# Patient Record
Sex: Female | Born: 1990 | Race: Black or African American | Hispanic: No | Marital: Married | Smoking: Never smoker
Health system: Southern US, Community
[De-identification: ages and names within clinical notes are randomized; demographics above are authoritative.]

## PROBLEM LIST (undated history)

## (undated) DIAGNOSIS — F32A Depression, unspecified: Secondary | ICD-10-CM

## (undated) DIAGNOSIS — E282 Polycystic ovarian syndrome: Secondary | ICD-10-CM

## (undated) DIAGNOSIS — J302 Other seasonal allergic rhinitis: Secondary | ICD-10-CM

## (undated) DIAGNOSIS — K219 Gastro-esophageal reflux disease without esophagitis: Secondary | ICD-10-CM

## (undated) DIAGNOSIS — F329 Major depressive disorder, single episode, unspecified: Secondary | ICD-10-CM

## (undated) DIAGNOSIS — B009 Herpesviral infection, unspecified: Secondary | ICD-10-CM

## (undated) DIAGNOSIS — F419 Anxiety disorder, unspecified: Secondary | ICD-10-CM

## (undated) DIAGNOSIS — R011 Cardiac murmur, unspecified: Secondary | ICD-10-CM

## (undated) DIAGNOSIS — L509 Urticaria, unspecified: Secondary | ICD-10-CM

## (undated) DIAGNOSIS — H6690 Otitis media, unspecified, unspecified ear: Secondary | ICD-10-CM

## (undated) HISTORY — DX: Polycystic ovarian syndrome: E28.2

## (undated) HISTORY — PX: ABDOMINAL HYSTERECTOMY: SHX81

## (undated) HISTORY — DX: Major depressive disorder, single episode, unspecified: F32.9

## (undated) HISTORY — DX: Gastro-esophageal reflux disease without esophagitis: K21.9

## (undated) HISTORY — PX: TOOTH EXTRACTION: SUR596

## (undated) HISTORY — PX: ESSURE TUBAL LIGATION: SUR464

## (undated) HISTORY — DX: Anxiety disorder, unspecified: F41.9

## (undated) HISTORY — DX: Cardiac murmur, unspecified: R01.1

## (undated) HISTORY — DX: Urticaria, unspecified: L50.9

## (undated) HISTORY — DX: Depression, unspecified: F32.A

## (undated) HISTORY — DX: Otitis media, unspecified, unspecified ear: H66.90

---

## 2010-10-05 ENCOUNTER — Other Ambulatory Visit: Payer: Self-pay | Admitting: Nurse Practitioner

## 2010-10-05 ENCOUNTER — Other Ambulatory Visit (HOSPITAL_COMMUNITY)
Admission: RE | Admit: 2010-10-05 | Discharge: 2010-10-05 | Disposition: A | Discharge status: Home / Self Care | Payer: Medicaid Other | Source: Ambulatory Visit | Attending: Nurse Practitioner | Admitting: Nurse Practitioner

## 2010-10-05 ENCOUNTER — Other Ambulatory Visit (HOSPITAL_COMMUNITY)
Admission: RE | Admit: 2010-10-05 | Discharge: 2010-10-05 | Disposition: A | Discharge status: Home / Self Care | Payer: Medicaid Other | Source: Ambulatory Visit | Attending: Family Medicine | Admitting: Family Medicine

## 2010-10-05 DIAGNOSIS — N870 Mild cervical dysplasia: Secondary | ICD-10-CM | POA: Insufficient documentation

## 2010-10-05 DIAGNOSIS — R87610 Atypical squamous cells of undetermined significance on cytologic smear of cervix (ASC-US): Secondary | ICD-10-CM | POA: Insufficient documentation

## 2010-12-22 ENCOUNTER — Emergency Department (HOSPITAL_COMMUNITY): Payer: Medicaid Other

## 2010-12-22 ENCOUNTER — Emergency Department (HOSPITAL_COMMUNITY)
Admission: EM | Admit: 2010-12-22 | Discharge: 2010-12-22 | Disposition: A | Discharge status: Home / Self Care | Payer: Medicaid Other | Attending: Emergency Medicine | Admitting: Emergency Medicine

## 2010-12-22 ENCOUNTER — Encounter (HOSPITAL_COMMUNITY): Payer: Self-pay | Admitting: Radiology

## 2010-12-22 DIAGNOSIS — R6889 Other general symptoms and signs: Secondary | ICD-10-CM | POA: Insufficient documentation

## 2010-12-22 LAB — POCT PREGNANCY, URINE: Preg Test, Ur: NEGATIVE

## 2010-12-22 MED ORDER — IOHEXOL 300 MG/ML  SOLN
75.0000 mL | Freq: Once | INTRAMUSCULAR | Status: AC | PRN
  Administered 2010-12-22: 75 mL via INTRAVENOUS

## 2011-03-29 ENCOUNTER — Encounter: Payer: Self-pay | Admitting: Cardiology

## 2011-03-29 ENCOUNTER — Ambulatory Visit (INDEPENDENT_AMBULATORY_CARE_PROVIDER_SITE_OTHER): Payer: Medicaid Other | Admitting: Cardiology

## 2011-03-29 VITALS — BP 129/78 | HR 93 | Ht 63.0 in | Wt 176.2 lb

## 2011-03-29 DIAGNOSIS — K219 Gastro-esophageal reflux disease without esophagitis: Secondary | ICD-10-CM

## 2011-03-29 DIAGNOSIS — R079 Chest pain, unspecified: Secondary | ICD-10-CM

## 2011-03-29 DIAGNOSIS — R011 Cardiac murmur, unspecified: Secondary | ICD-10-CM

## 2011-03-29 NOTE — Progress Notes (Signed)
Clinical Summary Ms. Christina Mcdaniel is a 20 y.o.female referred for cardiology consultation by Dr. Dimas Aguas, through the [facility] Department. Very limited records are available. 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 Patient states she moved to [place] last July, has not had cardiology followup since then. Details of her diagnosis are not available as yet. She does report having an echocardiogram done in September of 2010. She thinks that she was told that she had a "hole in a valve." She has not been on any specific antibiotic prophylaxis. She was told that she would likely only need clinical followup on an annual basis unless any symptoms developed.  She reports no functional limitation, exercises 3 or 4 days a week doing aerobic activity for 45 minutes. She does report a lifelong history of intermittent chest pain when she "gets upset." This has not changed in frequency or intensity. She has also had some reflux symptoms which are now improved on proton pump inhibitor.   No Known Allergies  Medication list reviewed.  Past Medical History  Diagnosis Date  . Cardiac murmur   . GERD (gastroesophageal reflux disease)   . Otitis     History reviewed. No pertinent past surgical history.  Family History  Problem Relation Age of Onset  . Heart murmur Father   . Heart murmur Paternal Grandmother   . Heart failure Paternal Aunt     Social History Ms. Christina Mcdaniel reports that she has never smoked. She has never used smokeless tobacco. Ms. Christina Mcdaniel reports that she drinks alcohol.  Review of Systems No reported palpitations, dizziness, or syncope. No orthopnea or PND. No lower extremity edema. Otherwise reviewed and negative.  Physical Examination Filed Vitals:   03/29/11 1009  BP: 129/78  Pulse: 93  Normally nourished appearing young woman in no acute distress. HEENT:  Conjunctiva and lids normal, oropharynx clear. Neck: Supple, no elevated JVD, no carotid bruits. Lungs: Clear to auscultation, nonlabored. Cardiac: Regular rate and rhythm, 2-3/6 systolic murmur heard best at the left upper sternal border toward apex. No change from sitting to standing. No diastolic murmur. No obvious click. No S3. Abdomen: Soft, nontender, bowel sounds present. Skin: Warm and dry. Musculoskeletal: No gross deformities. Neuropsychiatric: Alert and oriented x3, affect appropriate.   ECG Possible ectopic atrial rhythm at 80 beats per minute, RSR prime in lead V1, early repolarization. No old tracing for comparison.    Problem List and Plan

## 2011-03-29 NOTE — Assessment & Plan Note (Signed)
Symptoms better with proton pump inhibitor.

## 2011-03-29 NOTE — Patient Instructions (Addendum)
Your physician you to follow up in 1 year. You will receive a reminder letter in the mail one-two months in advance. If you don't receive a letter, please call our office to schedule the follow-up appointment. Your physician recommends that you continue on your current medications as directed. Please refer to the Current Medication list given to you today. Your physician has requested that you have an echocardiogram. Echocardiography is a painless test that uses sound waves to create images of your heart. It provides your doctor with information about the size and shape of your heart and how well your heart's chambers and valves are working. This procedure takes approximately one hour. There are no restrictions for this procedure.

## 2011-03-29 NOTE — Assessment & Plan Note (Signed)
Atypical in description, no changes in frequency or intensity. Some of her symptoms seemed more consistent with reflux.

## 2011-03-29 NOTE — Assessment & Plan Note (Signed)
Described above. Patient reports no functional limitation. Plan is to request records from [place] including previous echocardiogram. We will also obtain a baseline echocardiogram here for comparison. It may be that she has a VSD or ASD, however details are not certain. Followup can be planned depending on records and echocardiogram.

## 2011-04-14 ENCOUNTER — Other Ambulatory Visit: Payer: Medicaid Other | Admitting: *Deleted

## 2011-04-26 ENCOUNTER — Encounter (HOSPITAL_COMMUNITY): Payer: Self-pay | Admitting: *Deleted

## 2011-04-26 ENCOUNTER — Emergency Department (HOSPITAL_COMMUNITY)
Admission: EM | Admit: 2011-04-26 | Discharge: 2011-04-26 | Disposition: A | Discharge status: Home / Self Care | Payer: Medicaid Other | Attending: Emergency Medicine | Admitting: Emergency Medicine

## 2011-04-26 DIAGNOSIS — N390 Urinary tract infection, site not specified: Secondary | ICD-10-CM

## 2011-04-26 DIAGNOSIS — J029 Acute pharyngitis, unspecified: Secondary | ICD-10-CM

## 2011-04-26 DIAGNOSIS — K219 Gastro-esophageal reflux disease without esophagitis: Secondary | ICD-10-CM | POA: Insufficient documentation

## 2011-04-26 LAB — URINE MICROSCOPIC-ADD ON

## 2011-04-26 LAB — URINALYSIS, ROUTINE W REFLEX MICROSCOPIC
Glucose, UA: NEGATIVE mg/dL
Specific Gravity, Urine: >1.03 — ABNORMAL HIGH (ref 1.005–1.030)
Urobilinogen, UA: 0.2 mg/dL (ref 0.0–1.0)

## 2011-04-26 MED ORDER — ONDANSETRON HCL 4 MG PO TABS
4.0000 mg | ORAL_TABLET | Freq: Once | ORAL | Status: AC
  Administered 2011-04-26: 4 mg via ORAL
  Filled 2011-04-26: qty 1

## 2011-04-26 MED ORDER — PHENAZOPYRIDINE HCL 95 MG PO TABS: 95.0000 mg | ORAL_TABLET | Freq: Three times a day (TID) | ORAL | Status: AC | PRN

## 2011-04-26 MED ORDER — CEPHALEXIN 500 MG PO CAPS
500.0000 mg | ORAL_CAPSULE | Freq: Once | ORAL | Status: AC
  Administered 2011-04-26: 500 mg via ORAL
  Filled 2011-04-26: qty 1

## 2011-04-26 MED ORDER — PHENAZOPYRIDINE HCL 100 MG PO TABS
100.0000 mg | ORAL_TABLET | Freq: Once | ORAL | Status: AC
  Administered 2011-04-26: 100 mg via ORAL
  Filled 2011-04-26: qty 1

## 2011-04-26 MED ORDER — CEPHALEXIN 500 MG PO CAPS: 500.0000 mg | ORAL_CAPSULE | Freq: Four times a day (QID) | ORAL | Status: AC

## 2011-04-26 NOTE — ED Provider Notes (Signed)
History     CSN: [id number] Arrival date & time: 04/26/2011  8:23 PM   First MD Initiated Contact with Patient 04/26/11 2113      Chief Complaint  Patient presents with  . Otalgia  . Dysuria    (Consider location/radiation/quality/duration/timing/severity/associated sxs/prior treatment) Patient is a 20 y.o. female presenting with ear pain and dysuria. The history is provided by the patient.  Otalgia This is a new problem. The current episode started more than 2 days ago. There is pain in both ears. The problem occurs daily. The problem has been gradually worsening. There has been no fever. The pain is moderate. Associated symptoms include ear discharge. Pertinent negatives include no rhinorrhea, no sore throat, no abdominal pain, no diarrhea, no vomiting, no neck pain and no cough. Her past medical history does not include chronic ear infection.  Dysuria  Pertinent negatives include no vomiting, no frequency and no hematuria.    Past Medical History  Diagnosis Date  . Cardiac murmur   . GERD (gastroesophageal reflux disease)   . Otitis     History reviewed. No pertinent past surgical history.  Family History  Problem Relation Age of Onset  . Heart murmur Father   . Heart murmur Paternal Grandmother   . Heart failure Paternal Aunt     History  Substance Use Topics  . Smoking status: Never Smoker   . Smokeless tobacco: Never Used  . Alcohol Use: Yes     Occasionally    OB History    Grav Para Term Preterm Abortions TAB SAB Ect Mult Living                  Review of Systems  Constitutional: Negative for activity change.       All ROS Neg except as noted in HPI  HENT: Positive for ear pain and ear discharge. Negative for nosebleeds, sore throat, rhinorrhea and neck pain.   Eyes: Negative for photophobia and discharge.  Respiratory: Negative for cough, shortness of breath and wheezing.   Cardiovascular: Negative for chest pain and palpitations.    Gastrointestinal: Negative for vomiting, abdominal pain, diarrhea and blood in stool.  Genitourinary: Positive for dysuria. Negative for frequency and hematuria.  Musculoskeletal: Negative for back pain and arthralgias.  Skin: Negative.   Neurological: Negative for dizziness, seizures and speech difficulty.  Psychiatric/Behavioral: Negative for hallucinations and confusion.    Allergies  Review of patient's allergies indicates no known allergies.  Home Medications   Current Outpatient Rx  Name Route Sig Dispense Refill  . ANTIPYRINE-BENZOCAINE 5.4-1.4 % OT SOLN Both Ears Place 2 drops into both ears daily as needed. For ear pain     . IBUPROFEN 200 MG PO TABS Oral Take 400 mg by mouth daily as needed. For pain     . ONE-DAILY MULTI VITAMINS PO TABS Oral Take 1 tablet by mouth daily.      Marland Kitchen OMEPRAZOLE MAGNESIUM 20 MG PO TBEC Oral Take 20 mg by mouth daily.      . ETONOGESTREL 68 MG [place] IMPL Subcutaneous Inject into the skin.        BP 138/83  Pulse 85  Temp(Src) 97.9 F (36.6 C) (Oral)  Resp 16  Ht 5\' 3"  (1.6 m)  Wt 180 lb (81.647 kg)  BMI 31.89 kg/m2  SpO2 100%  Physical Exam  Nursing note and vitals reviewed. Constitutional: She is oriented to person, place, and time. She appears well-developed and well-nourished.  Non-toxic appearance.  HENT:  Head:  Normocephalic.  Right Ear: Tympanic membrane and external ear normal.  Left Ear: Tympanic membrane and external ear normal.       Increase redness and mild swelling of the posterior pharynx. Uvula mildly swollen. Mild nasal congestion.  Eyes: EOM and lids are normal. Pupils are equal, round, and reactive to light.  Neck: Normal range of motion. Neck supple. Carotid bruit is not present.  Cardiovascular: Normal rate, regular rhythm, normal heart sounds, intact distal pulses and normal pulses.   Pulmonary/Chest: Breath sounds normal. No respiratory distress.  Abdominal: Soft. Bowel sounds are normal. There is no tenderness.  There is no guarding and no CVA tenderness.  Musculoskeletal: Normal range of motion.  Lymphadenopathy:       Head (right side): No submandibular adenopathy present.       Head (left side): No submandibular adenopathy present.    She has no cervical adenopathy.  Neurological: She is alert and oriented to person, place, and time. She has normal strength. No cranial nerve deficit or sensory deficit.  Skin: Skin is warm and dry.  Psychiatric: She has a normal mood and affect. Her speech is normal.    ED Course  Procedures (including critical care time)  Labs Reviewed  URINALYSIS, ROUTINE W REFLEX MICROSCOPIC - Abnormal; Notable for the following:    Appearance HAZY (*)    Specific Gravity, Urine >1.030 (*)    Hgb urine dipstick LARGE (*)    Leukocytes, UA SMALL (*)    All other components within normal limits  URINE MICROSCOPIC-ADD ON - Abnormal; Notable for the following:    Squamous Epithelial / LPF MANY (*)    Bacteria, UA FEW (*)    All other components within normal limits   No results found.   No diagnosis found.    MDM  I have reviewed nursing notes, vital signs, and all appropriate lab and imaging results for this patient.        Kathie Dike, [place] 04/28/11 Burna Mortimer

## 2011-04-26 NOTE — ED Notes (Signed)
Pt reports pain to both ears for 5 days and dysuria starting today

## 2011-04-28 NOTE — ED Provider Notes (Signed)
Medical screening examination/treatment/procedure(s) were performed by non-physician practitioner and as supervising physician I was immediately available for consultation/collaboration. Seyed Heffley, MD, FACEP   Emmaly Leech L Zayleigh Stroh, MD 04/28/11 2001 

## 2011-04-29 LAB — URINE CULTURE: Colony Count: 85000

## 2012-07-04 ENCOUNTER — Emergency Department (HOSPITAL_COMMUNITY)
Admission: EM | Admit: 2012-07-04 | Discharge: 2012-07-04 | Disposition: A | Discharge status: Home / Self Care | Payer: Self-pay | Attending: Emergency Medicine | Admitting: Emergency Medicine

## 2012-07-04 ENCOUNTER — Encounter (HOSPITAL_COMMUNITY): Payer: Self-pay

## 2012-07-04 DIAGNOSIS — Z3202 Encounter for pregnancy test, result negative: Secondary | ICD-10-CM | POA: Insufficient documentation

## 2012-07-04 DIAGNOSIS — Z8719 Personal history of other diseases of the digestive system: Secondary | ICD-10-CM | POA: Insufficient documentation

## 2012-07-04 DIAGNOSIS — Z87440 Personal history of urinary (tract) infections: Secondary | ICD-10-CM | POA: Insufficient documentation

## 2012-07-04 DIAGNOSIS — N739 Female pelvic inflammatory disease, unspecified: Secondary | ICD-10-CM | POA: Insufficient documentation

## 2012-07-04 DIAGNOSIS — R109 Unspecified abdominal pain: Secondary | ICD-10-CM | POA: Insufficient documentation

## 2012-07-04 DIAGNOSIS — R509 Fever, unspecified: Secondary | ICD-10-CM | POA: Insufficient documentation

## 2012-07-04 DIAGNOSIS — N730 Acute parametritis and pelvic cellulitis: Secondary | ICD-10-CM

## 2012-07-04 DIAGNOSIS — R011 Cardiac murmur, unspecified: Secondary | ICD-10-CM | POA: Insufficient documentation

## 2012-07-04 LAB — URINALYSIS, ROUTINE W REFLEX MICROSCOPIC
Bilirubin Urine: NEGATIVE
Ketones, ur: NEGATIVE mg/dL
Leukocytes, UA: NEGATIVE
Nitrite: NEGATIVE
Urobilinogen, UA: 0.2 mg/dL (ref 0.0–1.0)
pH: 7 (ref 5.0–8.0)

## 2012-07-04 LAB — WET PREP, GENITAL

## 2012-07-04 MED ORDER — DOXYCYCLINE HYCLATE 100 MG PO CAPS: 100.0000 mg | ORAL_CAPSULE | Freq: Two times a day (BID) | ORAL | Status: DC

## 2012-07-04 MED ORDER — METRONIDAZOLE 500 MG PO TABS: 500.0000 mg | ORAL_TABLET | Freq: Two times a day (BID) | ORAL | Status: DC

## 2012-07-04 MED ORDER — LIDOCAINE HCL (PF) 1 % IJ SOLN
INTRAMUSCULAR | Status: AC
  Administered 2012-07-04: 0.9 mL
  Filled 2012-07-04: qty 5

## 2012-07-04 MED ORDER — CEFTRIAXONE SODIUM 250 MG IJ SOLR
250.0000 mg | Freq: Once | INTRAMUSCULAR | Status: AC
  Administered 2012-07-04: 250 mg via INTRAMUSCULAR
  Filled 2012-07-04: qty 250

## 2012-07-04 MED ORDER — HYDROCODONE-ACETAMINOPHEN 5-325 MG PO TABS: 1.0000 | ORAL_TABLET | Freq: Four times a day (QID) | ORAL | Status: AC | PRN

## 2012-07-04 NOTE — ED Notes (Signed)
Pt repositioned, delay explained to pt

## 2012-07-04 NOTE — ED Provider Notes (Signed)
History   This chart was scribed for Shelda Jakes, MD, by Frederik Pear, ER scribe. The patient was seen in room APA17/AP[facility]17 and the patient's care was started at 0942.     CSN: [id number]  Arrival date & time 07/04/12  0930   First MD Initiated Contact with Patient 07/04/12 [phone]      Chief Complaint  Patient presents with  . Dysuria    (Consider location/radiation/quality/duration/timing/severity/associated sxs/prior treatment) HPI  Christina Mcdaniel is a 22 y.o. female with a h/o of UTIs who presents to the Emergency Department complaining of contant, moderate fever and chills with associated dysuria and sharp bilateral abdominal pain that began 4 days ago. In ED, her temperature is 98.2. She denies any nausea, emesis, back pain, chest pain, SOB, neck pain, cough, congestion, diarrhea, vaginal bleeding, polyuria, hematuria, or vaginal discharge. She states that her last menstrual period was Nov 20 and has not had a period in Dec due to starting a new birth control medication.  Past Medical History  Diagnosis Date  . Cardiac murmur   . GERD (gastroesophageal reflux disease)   . Otitis     Past Surgical History  Procedure Date  . Essure tubal ligation     Family History  Problem Relation Age of Onset  . Heart murmur Father   . Heart murmur Paternal Grandmother   . Heart failure Paternal Aunt     History  Substance Use Topics  . Smoking status: Never Smoker   . Smokeless tobacco: Never Used  . Alcohol Use: Yes     Comment: Occasionally    OB History    Grav Para Term Preterm Abortions TAB SAB Ect Mult Living                  Review of Systems  Constitutional: Positive for fever and chills.  HENT: Negative for congestion and neck pain.   Eyes: Negative for redness.  Respiratory: Negative for cough and shortness of breath.   Cardiovascular: Negative for chest pain.  Gastrointestinal: Positive for abdominal pain. Negative for nausea, vomiting and diarrhea.   Genitourinary: Positive for dysuria. Negative for frequency, hematuria, vaginal bleeding and vaginal discharge.  Musculoskeletal: Negative for back pain.  Skin: Negative for rash.  Neurological: Negative for headaches.  Hematological: Does not bruise/bleed easily.  All other systems reviewed and are negative.    Allergies  Review of patient's allergies indicates no known allergies.  Home Medications   Current Outpatient Rx  Name  Route  Sig  Dispense  Refill  . IBUPROFEN 200 MG PO TABS   Oral   Take 400 mg by mouth daily as needed. For pain          . ONE-DAILY MULTI VITAMINS PO TABS   Oral   Take 1 tablet by mouth daily.           Marland Kitchen DOXYCYCLINE HYCLATE 100 MG PO CAPS   Oral   Take 1 capsule (100 mg total) by mouth 2 (two) times daily.   28 capsule   0   . HYDROCODONE-ACETAMINOPHEN 5-325 MG PO TABS   Oral   Take 1-2 tablets by mouth every 6 (six) hours as needed for pain.   10 tablet   0   . METRONIDAZOLE 500 MG PO TABS   Oral   Take 1 tablet (500 mg total) by mouth 2 (two) times daily.   28 tablet   0     BP 127/72  Pulse 87  Temp  98.2 F (36.8 C) (Oral)  Resp 20  Ht 5\' 3"  (1.6 m)  Wt 165 lb (74.844 kg)  BMI 29.23 kg/m2  SpO2 100%  LMP 05/31/2012  Physical Exam  Nursing note and vitals reviewed. Constitutional: She is oriented to person, place, and time. She appears well-developed and well-nourished.  HENT:  Head: Normocephalic and atraumatic.  Eyes: No scleral icterus.  Neck: Neck supple.  Cardiovascular: Normal rate and regular rhythm.   Pulmonary/Chest: Effort normal and breath sounds normal. No respiratory distress. She has no wheezes. She has no rales. She exhibits no tenderness.  Abdominal: Soft. Bowel sounds are normal. There is no tenderness.       Her upper and lower abdomen are non-tender.   Genitourinary: Vaginal discharge found.       Purulent vaginal discharge. Cervical motion tenderness. Uterine tenderness. Posterior introitus  area with 5 mm excoration. No bleeding.   Musculoskeletal: Normal range of motion. She exhibits no edema.  Neurological: She is alert and oriented to person, place, and time. No cranial nerve deficit. She exhibits normal muscle tone. Coordination normal.  Skin: No rash noted.  Psychiatric: She has a normal mood and affect. Thought content normal.    ED Course  Procedures (including critical care time)  DIAGNOSTIC STUDIES: Oxygen Saturation is 100% on room air, normal by my interpretation.    COORDINATION OF CARE:  10:15- Discussed planned course of treatment with the patient, including a UA and pregnancy test, who is agreeable at this time.  Results for orders placed during the hospital encounter of 07/04/12  URINALYSIS, ROUTINE W REFLEX MICROSCOPIC      Component Value Range   Color, Urine YELLOW  YELLOW   APPearance CLEAR  CLEAR   Specific Gravity, Urine 1.020  1.005 - 1.030   pH 7.0  5.0 - 8.0   Glucose, UA NEGATIVE  NEGATIVE mg/dL   Hgb urine dipstick NEGATIVE  NEGATIVE   Bilirubin Urine NEGATIVE  NEGATIVE   Ketones, ur NEGATIVE  NEGATIVE mg/dL   Protein, ur NEGATIVE  NEGATIVE mg/dL   Urobilinogen, UA 0.2  0.0 - 1.0 mg/dL   Nitrite NEGATIVE  NEGATIVE   Leukocytes, UA NEGATIVE  NEGATIVE  PREGNANCY, URINE      Component Value Range   Preg Test, Ur NEGATIVE  NEGATIVE  GC/CHLAMYDIA PROBE AMP      Component Value Range   CT Probe RNA NEGATIVE  NEGATIVE   GC Probe RNA NEGATIVE  NEGATIVE  WET PREP, GENITAL      Component Value Range   Yeast Wet Prep HPF POC NONE SEEN  NONE SEEN   Trich, Wet Prep NONE SEEN  NONE SEEN   Clue Cells Wet Prep HPF POC FEW (*) NONE SEEN   WBC, Wet Prep HPF POC FEW (*) NONE SEEN   Labs Reviewed  WET PREP, GENITAL - Abnormal; Notable for the following:    Clue Cells Wet Prep HPF POC FEW (*)     WBC, Wet Prep HPF POC FEW (*)     All other components within normal limits  URINALYSIS, ROUTINE W REFLEX MICROSCOPIC  PREGNANCY, URINE  GC/CHLAMYDIA  PROBE AMP  LAB REPORT - SCANNED   No results found.   1. PID (acute pelvic inflammatory disease)       MDM  Patient with dysuria but UA not CW UTI. Further work up and pelvic consistent with PID, pregnancy test negative. Rx in ED with Rocephin and will be continued on Doxy and Flagyl.  I personally performed the services described in this documentation, which was scribed in my presence. The recorded information has been reviewed and is accurate.          Shelda Jakes, MD 07/07/12 [phone]

## 2012-07-04 NOTE — ED Notes (Signed)
Pt c/o fever, chills, abd pain, and pain with urination x 4 days.  Denies any n/v.

## 2012-07-04 NOTE — ED Notes (Signed)
States she started fevers and chills along with sinus congestion 4 days ago.  States she started having difficulty with urination, burning with urination and painful external vaginal area 2 days ago.

## 2012-07-05 LAB — GC/CHLAMYDIA PROBE AMP: GC Probe RNA: NEGATIVE

## 2012-07-07 ENCOUNTER — Emergency Department (HOSPITAL_COMMUNITY): Payer: Medicaid Other

## 2012-07-07 ENCOUNTER — Emergency Department (HOSPITAL_COMMUNITY)
Admission: EM | Admit: 2012-07-07 | Discharge: 2012-07-07 | Disposition: A | Discharge status: Home / Self Care | Payer: Medicaid Other | Attending: Emergency Medicine | Admitting: Emergency Medicine

## 2012-07-07 ENCOUNTER — Encounter (HOSPITAL_COMMUNITY): Payer: Self-pay | Admitting: *Deleted

## 2012-07-07 DIAGNOSIS — Z8719 Personal history of other diseases of the digestive system: Secondary | ICD-10-CM | POA: Insufficient documentation

## 2012-07-07 DIAGNOSIS — Z79899 Other long term (current) drug therapy: Secondary | ICD-10-CM | POA: Insufficient documentation

## 2012-07-07 DIAGNOSIS — R112 Nausea with vomiting, unspecified: Secondary | ICD-10-CM | POA: Insufficient documentation

## 2012-07-07 DIAGNOSIS — N898 Other specified noninflammatory disorders of vagina: Secondary | ICD-10-CM | POA: Insufficient documentation

## 2012-07-07 DIAGNOSIS — R109 Unspecified abdominal pain: Secondary | ICD-10-CM | POA: Insufficient documentation

## 2012-07-07 DIAGNOSIS — R195 Other fecal abnormalities: Secondary | ICD-10-CM | POA: Insufficient documentation

## 2012-07-07 DIAGNOSIS — R0602 Shortness of breath: Secondary | ICD-10-CM | POA: Insufficient documentation

## 2012-07-07 DIAGNOSIS — N83209 Unspecified ovarian cyst, unspecified side: Secondary | ICD-10-CM

## 2012-07-07 DIAGNOSIS — R011 Cardiac murmur, unspecified: Secondary | ICD-10-CM | POA: Insufficient documentation

## 2012-07-07 LAB — CBC WITH DIFFERENTIAL/PLATELET
Basophils Relative: 0 % (ref 0–1)
HCT: 41.6 % (ref 36.0–46.0)
Hemoglobin: 14.4 g/dL (ref 12.0–15.0)
Lymphocytes Relative: 17 % (ref 12–46)
Lymphs Abs: 1 10*3/uL (ref 0.7–4.0)
MCHC: 34.6 g/dL (ref 30.0–36.0)
Monocytes Absolute: 0.7 10*3/uL (ref 0.1–1.0)
Monocytes Relative: 12 % (ref 3–12)
Neutro Abs: 4.1 10*3/uL (ref 1.7–7.7)
Neutrophils Relative %: 71 % (ref 43–77)
RBC: 4.62 MIL/uL (ref 3.87–5.11)
WBC: 5.8 10*3/uL (ref 4.0–10.5)

## 2012-07-07 LAB — URINE MICROSCOPIC-ADD ON

## 2012-07-07 LAB — URINALYSIS, ROUTINE W REFLEX MICROSCOPIC
Bilirubin Urine: NEGATIVE
Glucose, UA: NEGATIVE mg/dL
Nitrite: NEGATIVE
Specific Gravity, Urine: >1.03 — ABNORMAL HIGH (ref 1.005–1.030)
pH: 6 (ref 5.0–8.0)

## 2012-07-07 LAB — BASIC METABOLIC PANEL
BUN: 11 mg/dL (ref 6–23)
CO2: 26 mEq/L (ref 19–32)
Chloride: 99 mEq/L (ref 96–112)
Creatinine, Ser: 0.82 mg/dL (ref 0.50–1.10)
GFR calc Af Amer: >90 mL/min (ref >90)
Potassium: 3.2 mEq/L — ABNORMAL LOW (ref 3.5–5.1)

## 2012-07-07 MED ORDER — PHENAZOPYRIDINE HCL 200 MG PO TABS: 200.0000 mg | ORAL_TABLET | Freq: Three times a day (TID) | ORAL | Status: DC | PRN

## 2012-07-07 MED ORDER — IBUPROFEN 800 MG PO TABS: 800.0000 mg | ORAL_TABLET | Freq: Three times a day (TID) | ORAL | Status: DC

## 2012-07-07 MED ORDER — SODIUM CHLORIDE 0.9 % IV BOLUS (SEPSIS)
500.0000 mL | Freq: Once | INTRAVENOUS | Status: AC
  Administered 2012-07-07: 500 mL via INTRAVENOUS

## 2012-07-07 MED ORDER — FLUCONAZOLE 150 MG PO TABS: 150.0000 mg | ORAL_TABLET | Freq: Once | ORAL | Status: DC

## 2012-07-07 MED ORDER — IOHEXOL 300 MG/ML  SOLN
100.0000 mL | Freq: Once | INTRAMUSCULAR | Status: AC | PRN
  Administered 2012-07-07: 100 mL via INTRAVENOUS

## 2012-07-07 NOTE — ED Notes (Signed)
Patient with no complaints at this time. Respirations even and unlabored. Skin warm/dry. Discharge instructions reviewed with patient at this time. Patient given opportunity to voice concerns/ask questions. IV removed per policy and band-aid applied to site. Patient discharged at this time and left Emergency Department with steady gait.  

## 2012-07-07 NOTE — ED Notes (Signed)
States vaginal discharge has worsened since being seen this week.  It is more "watery" and "drips" when standing.  Pelvic pain has also worsened.

## 2012-07-07 NOTE — ED Notes (Signed)
Here a couple of days ago for same and is worse. Dx with PID and taking antibiotics. Vomiting also. Dysuria, chills, body aches, headache, sore throat .

## 2012-07-07 NOTE — ED Provider Notes (Signed)
History  This chart was scribed for Gilda Crease, MD by Bennett Scrape, ED Scribe. This patient was seen in room [facility]08/[facility]08 and the patient's care was started at 12:56 PM.  CSN: [id number]  Arrival date & time 07/07/12  1130   First MD Initiated Contact with Patient 07/07/12 1256      Chief Complaint  Patient presents with  . Dysuria    The history is provided by the patient. No language interpreter was used.    Christina Mcdaniel is a 22 y.o. female who presents to the Emergency Department complaining of 8 days of sudden onset, non-changing, constant dysuria with associated subjective fevers, chills, myalgias, vaginal discharge described as watery and dripping, sore throat, nausea, non-bloody emesis and suprapubic abdominal pain. Temperature is 98.6 in the ED. She reports that the abdominal pain is worse with walking and that the vaginal discharge has been getting worse since the onset of the symptoms. She was seen for the same 3 days ago and treated for PID. She was given Rocephin in the ED and discharged with Doxycycline and Flagyl with no improvement. During her last visit, she reported that she recently started a new birth control medication last month. She denies vaginal bleeding, pelvic pain, frequency and hematemesis as associated symptoms. She has a h/o GERD and is an occasional alcohol user but denies smoking.   Past Medical History  Diagnosis Date  . Cardiac murmur   . GERD (gastroesophageal reflux disease)   . Otitis     Past Surgical History  Procedure Date  . Essure tubal ligation     Family History  Problem Relation Age of Onset  . Heart murmur Father   . Heart murmur Paternal Grandmother   . Heart failure Paternal Aunt     History  Substance Use Topics  . Smoking status: Never Smoker   . Smokeless tobacco: Never Used  . Alcohol Use: Yes     Comment: Occasionally    No OB history provided.  Review of Systems  Constitutional: Positive for  chills. Negative for fever.  HENT: Positive for sore throat. Negative for congestion.   Cardiovascular: Negative for chest pain.  Gastrointestinal: Positive for nausea, vomiting and abdominal pain. Negative for diarrhea.  Genitourinary: Positive for dysuria and vaginal discharge. Negative for frequency and vaginal bleeding.  Musculoskeletal: Positive for myalgias. Negative for back pain.  All other systems reviewed and are negative.    Allergies  Review of patient's allergies indicates no known allergies.  Home Medications   Current Outpatient Rx  Name  Route  Sig  Dispense  Refill  . DOXYCYCLINE HYCLATE 100 MG PO CAPS   Oral   Take 1 capsule (100 mg total) by mouth 2 (two) times daily.   28 capsule   0   . HYDROCODONE-ACETAMINOPHEN 5-325 MG PO TABS   Oral   Take 1-2 tablets by mouth every 6 (six) hours as needed for pain.   10 tablet   0   . IBUPROFEN 200 MG PO TABS   Oral   Take 400 mg by mouth daily as needed. For pain          . METRONIDAZOLE 500 MG PO TABS   Oral   Take 1 tablet (500 mg total) by mouth 2 (two) times daily.   28 tablet   0   . ONE-DAILY MULTI VITAMINS PO TABS   Oral   Take 1 tablet by mouth daily.  Triage Vitals: BP 113/53  Pulse 105  Temp 98.6 F (37 C) (Oral)  Resp 16  Ht 5\' 3"  (1.6 m)  Wt 165 lb (74.844 kg)  BMI 29.23 kg/m2  SpO2 100%  LMP 05/31/2012  Physical Exam  Nursing note and vitals reviewed. Constitutional: She is oriented to person, place, and time. She appears well-developed and well-nourished.  HENT:  Head: Normocephalic and atraumatic.  Eyes: Conjunctivae normal and EOM are normal.  Neck: Neck supple.  Cardiovascular: Normal rate and regular rhythm.   Pulmonary/Chest: Effort normal and breath sounds normal.  Abdominal: Soft. There is tenderness (bilateral suprapibic tenderness) in the suprapubic area. There is no rigidity, no rebound, no guarding, no tenderness at McBurney's point and negative Murphy's  sign.  Musculoskeletal: Normal range of motion. She exhibits no edema.  Neurological: She is alert and oriented to person, place, and time. No cranial nerve deficit.  Psychiatric: She has a normal mood and affect. Her behavior is normal.    ED Course  Procedures (including critical care time)  DIAGNOSTIC STUDIES: Oxygen Saturation is 100% on room air, normal by my interpretation.    COORDINATION OF CARE:    Labs Reviewed  BASIC METABOLIC PANEL - Abnormal; Notable for the following:    Sodium 131 (*)     Potassium 3.2 (*)     Glucose, Bld 108 (*)     All other components within normal limits  URINALYSIS, ROUTINE W REFLEX MICROSCOPIC - Abnormal; Notable for the following:    Specific Gravity, Urine >1.030 (*)     Ketones, ur TRACE (*)     Protein, ur 30 (*)     Leukocytes, UA TRACE (*)     All other components within normal limits  URINE MICROSCOPIC-ADD ON - Abnormal; Notable for the following:    Squamous Epithelial / LPF FEW (*)     All other components within normal limits  CBC WITH DIFFERENTIAL  PREGNANCY, URINE   US Transvaginal Non-ob  07/07/2012  *RADIOLOGY REPORT*  Clinical Data:  Pelvic pain, right lower quadrant mass on prior exam  TRANSABDOMINAL AND TRANSVAGINAL ULTRASOUND OF PELVIS DOPPLER ULTRASOUND OF OVARIES  Technique:  Both transabdominal and transvaginal ultrasound examinations of the pelvis were performed. Transabdominal technique was performed for global imaging of the pelvis including uterus, ovaries, adnexal regions, and pelvic cul-de-sac.  It was necessary to proceed with endovaginal exam following the transabdominal exam to visualize the endometrium and ovaries.  Color and duplex Doppler ultrasound was utilized to evaluate blood flow to the ovaries.  Comparison:  CT same date  Findings:  Uterus:  9.1 x 5.1 x 4.9 cm.  Anteverted, anteflexed.  Essure implants incidentally noted.  No focal abnormality.  Endometrium:  1.3 cm.  Borderline trilaminar in appearance  without focal abnormality.  Right ovary: 5.7 x 2.9 x 2.1 cm.  Probable physiologic cyst measuring 2.5 x 2.0 x 2.0 cm.  Otherwise normal in appearance.  Left ovary:   3.6 x 2.5 x 2.0 cm.  Normal.  Pulsed Doppler evaluation demonstrates normal low-resistance arterial and venous waveforms in both ovaries.  IMPRESSION: Physiologic cyst in the right ovary and overall right ovarian prominence in size corresponds to the CT finding, no evidence for acute ovarian torsion or other abnormality.  No sonographic evidence for ovarian torsion.   Original Report Authenticated By: Christiana Pellant, M.D.    US Pelvis Complete  07/07/2012  *RADIOLOGY REPORT*  Clinical Data:  Pelvic pain, right lower quadrant mass on prior exam  TRANSABDOMINAL  AND TRANSVAGINAL ULTRASOUND OF PELVIS DOPPLER ULTRASOUND OF OVARIES  Technique:  Both transabdominal and transvaginal ultrasound examinations of the pelvis were performed. Transabdominal technique was performed for global imaging of the pelvis including uterus, ovaries, adnexal regions, and pelvic cul-de-sac.  It was necessary to proceed with endovaginal exam following the transabdominal exam to visualize the endometrium and ovaries.  Color and duplex Doppler ultrasound was utilized to evaluate blood flow to the ovaries.  Comparison:  CT same date  Findings:  Uterus:  9.1 x 5.1 x 4.9 cm.  Anteverted, anteflexed.  Essure implants incidentally noted.  No focal abnormality.  Endometrium:  1.3 cm.  Borderline trilaminar in appearance without focal abnormality.  Right ovary: 5.7 x 2.9 x 2.1 cm.  Probable physiologic cyst measuring 2.5 x 2.0 x 2.0 cm.  Otherwise normal in appearance.  Left ovary:   3.6 x 2.5 x 2.0 cm.  Normal.  Pulsed Doppler evaluation demonstrates normal low-resistance arterial and venous waveforms in both ovaries.  IMPRESSION: Physiologic cyst in the right ovary and overall right ovarian prominence in size corresponds to the CT finding, no evidence for acute ovarian torsion or other  abnormality.  No sonographic evidence for ovarian torsion.   Original Report Authenticated By: Christiana Pellant, M.D.    Ct Abdomen Pelvis W Contrast  07/07/2012  *RADIOLOGY REPORT*  Clinical Data: Fever, lower abdominal pain  CT ABDOMEN AND PELVIS WITH CONTRAST  Technique:  Multidetector CT imaging of the abdomen and pelvis was performed following the standard protocol during bolus administration of intravenous contrast.  Contrast: OMNIPAQUE IOHEXOL 300 MG/ML  SOLN  Comparison: None.  Findings: Upper abdominal viscera are unremarkable.  Lung bases are clear.  No ascites or lymphadenopathy.  No bowel wall thickening or focal segmental dilatation.  In the right lower quadrant, there is a cylindrical/tubular structure which has intermediate low density measuring 6.3 x 2.6 cm, for example image 67 series 2.  This is immediately contiguous with the base of the cecum and the right adnexa.  Essure implants are in place.  The left ovary is thought to be visualized along the left lateral pelvic sidewall image 69.  The right ovary is not definitely identified separate from the right adnexal structure. There is a possible collapsing 2.1 cm right ovarian cyst image 68 associated with the previously described tubular structure.  A small amount of free pelvic fluid is identified.  Bladder is unremarkable.  No acute osseous finding.  IMPRESSION: Right adnexal mass like structure, for which differential considerations include abnormal appearing ovary with internal collapsing hemorrhagic cyst, possible dilated appendix with internal mucin, less likely hydrosalpinx, tubal ovarian abscess, or atypical endometrioma.  Pelvic ultrasound with Doppler is recommended for further evaluation.   Original Report Authenticated By: Christiana Pellant, M.D.    [place] Art/ven Flow Abd Pelv Doppler  07/07/2012  *RADIOLOGY REPORT*  Clinical Data:  Pelvic pain, right lower quadrant mass on prior exam  TRANSABDOMINAL AND TRANSVAGINAL ULTRASOUND OF  PELVIS DOPPLER ULTRASOUND OF OVARIES  Technique:  Both transabdominal and transvaginal ultrasound examinations of the pelvis were performed. Transabdominal technique was performed for global imaging of the pelvis including uterus, ovaries, adnexal regions, and pelvic cul-de-sac.  It was necessary to proceed with endovaginal exam following the transabdominal exam to visualize the endometrium and ovaries.  Color and duplex Doppler ultrasound was utilized to evaluate blood flow to the ovaries.  Comparison:  CT same date  Findings:  Uterus:  9.1 x 5.1 x 4.9 cm.  Anteverted, anteflexed.  Essure implants incidentally noted.  No focal abnormality.  Endometrium:  1.3 cm.  Borderline trilaminar in appearance without focal abnormality.  Right ovary: 5.7 x 2.9 x 2.1 cm.  Probable physiologic cyst measuring 2.5 x 2.0 x 2.0 cm.  Otherwise normal in appearance.  Left ovary:   3.6 x 2.5 x 2.0 cm.  Normal.  Pulsed Doppler evaluation demonstrates normal low-resistance arterial and venous waveforms in both ovaries.  IMPRESSION: Physiologic cyst in the right ovary and overall right ovarian prominence in size corresponds to the CT finding, no evidence for acute ovarian torsion or other abnormality.  No sonographic evidence for ovarian torsion.   Original Report Authenticated By: Christiana Pellant, M.D.      1. Ovarian cyst       MDM  Patient presents to the ER for evaluation of pelvic pain with vaginal discharge. Patient was seen 3 days ago in this emergency department and diagnosed with pelvic inflammatory disease. Review the records. Patient had purulent discharge and cervical motion tenderness on pelvic exam. Patient was treated appropriately with Rocephin, doxycycline and Flagyl. Patient does tell me that she was able to get all the prescriptions filled it is not improving. She also reports that the Lortab makes her nauseated and has caused vomiting, so she has not been able to take it for the pain. Patient's GC and  Chlamydia results are back from the other day and both are negative. Her urinalysis was unremarkable the other day as well.  Since GC and Chlamydia were negative, I did consider the possibility of other inflammatory processes in the abdomen and pelvis causing her cervicitis. Patient had lab work and a CAT scan performed. CAT scan showed a structure in the area of the right adnexa that could be a cyst, abscess or possibly the appendix. It was recommended that the patient undergo ultrasound. This was performed and the ultrasound does show that the structure seen is the physiologic cyst in the right ovary. Patient will be discharged with analgesia and continue the treatment plan initiated at her previous visit.    I personally performed the services described in this documentation, which was scribed in my presence. The recorded information has been reviewed and is accurate.    Gilda Crease, MD 07/07/12 [phone]

## 2012-07-07 NOTE — ED Notes (Signed)
Pt gone to xray

## 2012-07-19 ENCOUNTER — Encounter: Payer: Self-pay | Admitting: Obstetrics & Gynecology

## 2012-07-19 ENCOUNTER — Ambulatory Visit (INDEPENDENT_AMBULATORY_CARE_PROVIDER_SITE_OTHER): Payer: Self-pay | Admitting: Medical

## 2012-07-19 VITALS — BP 103/66 | HR 93 | Temp 97.5°F | Ht 63.0 in | Wt 163.5 lb

## 2012-07-19 DIAGNOSIS — N83209 Unspecified ovarian cyst, unspecified side: Secondary | ICD-10-CM

## 2012-07-19 NOTE — Patient Instructions (Addendum)
Bacterial Vaginosis Bacterial vaginosis is an infection of the vagina. A healthy vagina has many kinds of good germs (bacteria). Sometimes the number of good germs can change. This allows bad germs to move in and cause an infection. You may be given medicine (antibiotics) to treat the infection. Or, you may not need treatment at all. HOME CARE  Take your medicine as told. Finish them even if you start to feel better.  Do not have sex until you finish your medicine.  Do not douche.  Practice safe sex.  Tell your sex partner that you have an infection. They should see their doctor for treatment if they have problems. GET HELP RIGHT AWAY IF:  You do not get better after 3 days of treatment.  You have grey fluid (discharge) coming from your vagina.  You have pain.  You have a temperature of 102 F (38.9 C) or higher. MAKE SURE YOU:   Understand these instructions.  Will watch your condition.  Will get help right away if you are not doing well or get worse. Document Released: 03/29/2008 Document Revised: 09/12/2011 Document Reviewed: 03/29/2008 [facility] Patient Information 2013 [place], [place].  Use only gentle soaps, cleansers and lotions. Dove for sensitive skin is the most mild. Do not use products with dyes or fragrances.  Avoid douching Probiotics can be found in the vitamin section of the pharmacy. You can take one daily with food.  Rephresh feminine washes are also good.

## 2012-07-19 NOTE — Progress Notes (Signed)
Patient ID: Christina Mcdaniel, female   DOB: 1990/12/23, 23 y.o.   MRN: [id number] History:  Christina Mcdaniel is a 22 y.o. [phone] who presents to clinic today for follow-up from ED visit at [facility]. The patient was diagnosed with a 2.5 x 2.0 x 2.0 cm physiologic cyst on her left ovary at that time and told to follow-up with a gyn. The patient continues to have pelvic pain which she reports is well-controlled by ibuprofen prescribed in the ED. The patient rates her pain at 5/10 today. When in the ED on 07/07/12 the patient rated her pain at 8-9/10. The patient is also complaining of vaginal irritation today. She was seen at the [facility] on Monday, where she had previously been seen for GYN care, and had a HSV culture performed. The patient was put on acyclovir, but has not yet received the results of the culture. The patient also states that she has had irregular periods since having the Essure procedure in July 2013. She also states that she had an Implanon removed at this time. Her periods seem to be coming more frequently than she expects. They are, however, lighter and shorter in duration than they were prior to having the Implanon removed. The patient states her LMP was 07/16/12 and it lasted for approximately 4 days. Upon further discussion the patient states that she has not been keeping track of her periods lately and cannot verify that they are definitely irregular. The patient was also treated for PID in the ED when she was seen for pelvic pain. She states that she did not finish the entire course of antibiotics because they called her and told her that her cultures were negative. She did take 7-10 days worth of the antibiotics. She was +BV in the ED only. The patient also states that she has had abnormal pap smears since 2012. She has had a colpo and additional pap smears at the [facility]. They will continue to follow her for this.    The following portions of the patient's history were reviewed  and updated as appropriate: allergies, current medications, past family history, past medical history, past social history, past surgical history and problem list.  Review of Systems:  Pertinent items are noted in HPI.  Objective:  Physical Exam BP 103/66  Pulse 93  Temp 97.5 F (36.4 C) (Oral)  Ht 5\' 3"  (1.6 m)  Wt 163 lb 8 oz (74.163 kg)  BMI 28.96 kg/m2  LMP 05/31/2012 GENERAL: Well-developed, well-nourished female in no acute distress.  HEENT: Normocephalic, atraumatic.  LUNGS: Normal rate. Clear to auscultation bilaterally.  HEART: Regular rate and rhythm with no adventitious sounds.  ABDOMEN: Soft, nondistended. No organomegaly. Normal bowel sounds appreciated in all quadrants. Mild tenderness to palpation of the lower quadrants bilaterally.  PELVIC: Deferred as patient was examined at [facility] 4 days ago.  EXTREMITIES: No cyanosis, clubbing, or edema.   Labs and Imaging US Transvaginal Non-ob  07/07/2012  *RADIOLOGY REPORT*  Clinical Data:  Pelvic pain, right lower quadrant mass on prior exam  TRANSABDOMINAL AND TRANSVAGINAL ULTRASOUND OF PELVIS DOPPLER ULTRASOUND OF OVARIES  Technique:  Both transabdominal and transvaginal ultrasound examinations of the pelvis were performed. Transabdominal technique was performed for global imaging of the pelvis including uterus, ovaries, adnexal regions, and pelvic cul-de-sac.  It was necessary to proceed with endovaginal exam following the transabdominal exam to visualize the endometrium and ovaries.  Color and duplex Doppler ultrasound was utilized to evaluate blood flow to the ovaries.  Comparison:  CT same date  Findings:  Uterus:  9.1 x 5.1 x 4.9 cm.  Anteverted, anteflexed.  Essure implants incidentally noted.  No focal abnormality.  Endometrium:  1.3 cm.  Borderline trilaminar in appearance without focal abnormality.  Right ovary: 5.7 x 2.9 x 2.1 cm.  Probable physiologic cyst measuring 2.5 x 2.0 x 2.0 cm.  Otherwise normal in appearance.  Left  ovary:   3.6 x 2.5 x 2.0 cm.  Normal.  Pulsed Doppler evaluation demonstrates normal low-resistance arterial and venous waveforms in both ovaries.  IMPRESSION: Physiologic cyst in the right ovary and overall right ovarian prominence in size corresponds to the CT finding, no evidence for acute ovarian torsion or other abnormality.  No sonographic evidence for ovarian torsion.   Original Report Authenticated By: Christiana Pellant, M.D.    US Pelvis Complete  07/07/2012  *RADIOLOGY REPORT*  Clinical Data:  Pelvic pain, right lower quadrant mass on prior exam  TRANSABDOMINAL AND TRANSVAGINAL ULTRASOUND OF PELVIS DOPPLER ULTRASOUND OF OVARIES  Technique:  Both transabdominal and transvaginal ultrasound examinations of the pelvis were performed. Transabdominal technique was performed for global imaging of the pelvis including uterus, ovaries, adnexal regions, and pelvic cul-de-sac.  It was necessary to proceed with endovaginal exam following the transabdominal exam to visualize the endometrium and ovaries.  Color and duplex Doppler ultrasound was utilized to evaluate blood flow to the ovaries.  Comparison:  CT same date  Findings:  Uterus:  9.1 x 5.1 x 4.9 cm.  Anteverted, anteflexed.  Essure implants incidentally noted.  No focal abnormality.  Endometrium:  1.3 cm.  Borderline trilaminar in appearance without focal abnormality.  Right ovary: 5.7 x 2.9 x 2.1 cm.  Probable physiologic cyst measuring 2.5 x 2.0 x 2.0 cm.  Otherwise normal in appearance.  Left ovary:   3.6 x 2.5 x 2.0 cm.  Normal.  Pulsed Doppler evaluation demonstrates normal low-resistance arterial and venous waveforms in both ovaries.  IMPRESSION: Physiologic cyst in the right ovary and overall right ovarian prominence in size corresponds to the CT finding, no evidence for acute ovarian torsion or other abnormality.  No sonographic evidence for ovarian torsion.   Original Report Authenticated By: Christiana Pellant, M.D.    Ct Abdomen Pelvis W  Contrast  07/07/2012  *RADIOLOGY REPORT*  Clinical Data: Fever, lower abdominal pain  CT ABDOMEN AND PELVIS WITH CONTRAST  Technique:  Multidetector CT imaging of the abdomen and pelvis was performed following the standard protocol during bolus administration of intravenous contrast.  Contrast: OMNIPAQUE IOHEXOL 300 MG/ML  SOLN  Comparison: None.  Findings: Upper abdominal viscera are unremarkable.  Lung bases are clear.  No ascites or lymphadenopathy.  No bowel wall thickening or focal segmental dilatation.  In the right lower quadrant, there is a cylindrical/tubular structure which has intermediate low density measuring 6.3 x 2.6 cm, for example image 67 series 2.  This is immediately contiguous with the base of the cecum and the right adnexa.  Essure implants are in place.  The left ovary is thought to be visualized along the left lateral pelvic sidewall image 69.  The right ovary is not definitely identified separate from the right adnexal structure. There is a possible collapsing 2.1 cm right ovarian cyst image 68 associated with the previously described tubular structure.  A small amount of free pelvic fluid is identified.  Bladder is unremarkable.  No acute osseous finding.  IMPRESSION: Right adnexal mass like structure, for which differential considerations include abnormal appearing ovary  with internal collapsing hemorrhagic cyst, possible dilated appendix with internal mucin, less likely hydrosalpinx, tubal ovarian abscess, or atypical endometrioma.  Pelvic ultrasound with Doppler is recommended for further evaluation.   Original Report Authenticated By: Christiana Pellant, M.D.    [place] Art/ven Flow Abd Pelv Doppler  07/07/2012  *RADIOLOGY REPORT*  Clinical Data:  Pelvic pain, right lower quadrant mass on prior exam  TRANSABDOMINAL AND TRANSVAGINAL ULTRASOUND OF PELVIS DOPPLER ULTRASOUND OF OVARIES  Technique:  Both transabdominal and transvaginal ultrasound examinations of the pelvis were performed.  Transabdominal technique was performed for global imaging of the pelvis including uterus, ovaries, adnexal regions, and pelvic cul-de-sac.  It was necessary to proceed with endovaginal exam following the transabdominal exam to visualize the endometrium and ovaries.  Color and duplex Doppler ultrasound was utilized to evaluate blood flow to the ovaries.  Comparison:  CT same date  Findings:  Uterus:  9.1 x 5.1 x 4.9 cm.  Anteverted, anteflexed.  Essure implants incidentally noted.  No focal abnormality.  Endometrium:  1.3 cm.  Borderline trilaminar in appearance without focal abnormality.  Right ovary: 5.7 x 2.9 x 2.1 cm.  Probable physiologic cyst measuring 2.5 x 2.0 x 2.0 cm.  Otherwise normal in appearance.  Left ovary:   3.6 x 2.5 x 2.0 cm.  Normal.  Pulsed Doppler evaluation demonstrates normal low-resistance arterial and venous waveforms in both ovaries.  IMPRESSION: Physiologic cyst in the right ovary and overall right ovarian prominence in size corresponds to the CT finding, no evidence for acute ovarian torsion or other abnormality.  No sonographic evidence for ovarian torsion.   Original Report Authenticated By: Christiana Pellant, M.D.     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 Will follow-up with them for vaginal irritation as necessary Patient will follow-up with [facility] for follow-up pap smear as indicated. They may refer here if additional treatment is necessary Discussed possible treatment options for cervical dysplasia at patient request Discussed US/CT results of ovarian cyst and likelihood that this will resolve on its own. Discussed with Dr. Erin Fulling; she does not recommend follow-up US be scheduled at this time Patient will keep log of periods throughout the next 3-6 months to see if they begin to  regulate themselves following the removal of her Implanon.  Patient may follow-up PRN here if her periods continue to be irregular and bothersome.    Freddi Starr, PA-C 07/19/2012 4:24 PM

## 2012-07-23 ENCOUNTER — Encounter: Payer: Self-pay | Admitting: *Deleted

## 2013-07-11 ENCOUNTER — Encounter: Payer: Self-pay | Admitting: Nurse Practitioner

## 2013-07-24 ENCOUNTER — Emergency Department: Payer: Self-pay | Admitting: Emergency Medicine

## 2013-07-30 ENCOUNTER — Ambulatory Visit (INDEPENDENT_AMBULATORY_CARE_PROVIDER_SITE_OTHER): Payer: Managed Care, Other (non HMO) | Admitting: Obstetrics

## 2013-07-30 ENCOUNTER — Encounter: Payer: Self-pay | Admitting: Nurse Practitioner

## 2013-07-30 ENCOUNTER — Encounter: Payer: Self-pay | Admitting: Obstetrics

## 2013-07-30 VITALS — BP 128/76 | HR 79 | Temp 98.1°F | Ht 63.0 in | Wt 179.0 lb

## 2013-07-30 DIAGNOSIS — Z113 Encounter for screening for infections with a predominantly sexual mode of transmission: Secondary | ICD-10-CM

## 2013-07-30 DIAGNOSIS — Z01419 Encounter for gynecological examination (general) (routine) without abnormal findings: Secondary | ICD-10-CM

## 2013-07-30 LAB — WET PREP BY MOLECULAR PROBE
Candida species: NEGATIVE
Gardnerella vaginalis: POSITIVE — AB
Trichomonas vaginosis: NEGATIVE

## 2013-07-30 NOTE — Progress Notes (Signed)
Subjective:     Christina Mcdaniel is a 23 y.o. female here for a routine exam.  Current complaints: Patient is in office today for Annual exam. Patient states she has a history of abnormal pap smears. Patient states she has had colpos in the past.   Personal health questionnaire reviewed: yes.   Gynecologic History Patient's last menstrual period was 07/21/2013. Contraception: tubal ligation Last Pap: 07/2012. Results were: abnormal. Colpo in 2014  Obstetric History OB History  Gravida Para Term Preterm AB SAB TAB Ectopic Multiple Living  3 3 0 3      3    # Outcome Date GA Lbr Len/2nd Weight Sex Delivery Anes PTL Lv  3 PRE 02/14/09 224w0d  4 lb 6 oz (1.984 kg) F SVD None  Y  2 PRE 01/20/08 [id number]  5 lb 1 oz (2.296 kg) M SVD None  Y  1 PRE 11/29/05 724w0d  4 lb 8 oz (2.041 kg) M SVD None  Y       The following portions of the patient's history were reviewed and updated as appropriate: allergies, current medications, past family history, past medical history, past social history, past surgical history and problem list.  Review of Systems Pertinent items are noted in HPI.    Objective:    General appearance: alert and no distress Breasts: normal appearance, no masses or tenderness Abdomen: normal findings: soft, non-tender Pelvic: cervix normal in appearance, external genitalia normal, no adnexal masses or tenderness, no cervical motion tenderness, rectovaginal septum normal, uterus normal size, shape, and consistency and vagina normal without discharge    Assessment:    Healthy female exam.    Plan:    Follow up in: 1 year.

## 2013-07-31 LAB — PAP IG W/ RFLX HPV ASCU

## 2013-07-31 LAB — GC/CHLAMYDIA PROBE AMP
CT PROBE, AMP APTIMA: NEGATIVE
GC PROBE AMP APTIMA: NEGATIVE

## 2013-08-01 ENCOUNTER — Ambulatory Visit: Payer: Self-pay | Admitting: Physician Assistant

## 2013-08-01 ENCOUNTER — Encounter: Payer: Self-pay | Admitting: Obstetrics

## 2013-08-21 ENCOUNTER — Other Ambulatory Visit: Payer: Self-pay | Admitting: *Deleted

## 2013-08-21 DIAGNOSIS — N760 Acute vaginitis: Principal | ICD-10-CM

## 2013-08-21 DIAGNOSIS — B9689 Other specified bacterial agents as the cause of diseases classified elsewhere: Secondary | ICD-10-CM

## 2013-08-21 MED ORDER — METRONIDAZOLE 500 MG PO TABS: 500.0000 mg | ORAL_TABLET | Freq: Two times a day (BID) | ORAL | Status: DC

## 2013-08-29 ENCOUNTER — Ambulatory Visit: Payer: Self-pay | Admitting: Physician Assistant

## 2014-05-05 ENCOUNTER — Encounter: Payer: Self-pay | Admitting: Obstetrics

## 2014-05-13 ENCOUNTER — Emergency Department: Payer: Self-pay | Admitting: Emergency Medicine

## 2015-03-16 ENCOUNTER — Ambulatory Visit: Payer: Managed Care, Other (non HMO) | Admitting: Obstetrics

## 2015-04-27 ENCOUNTER — Ambulatory Visit: Payer: Managed Care, Other (non HMO) | Admitting: Obstetrics

## 2015-05-13 ENCOUNTER — Ambulatory Visit: Payer: Managed Care, Other (non HMO) | Admitting: Obstetrics

## 2015-05-13 ENCOUNTER — Encounter: Payer: Self-pay | Admitting: Obstetrics and Gynecology

## 2015-05-13 ENCOUNTER — Ambulatory Visit (INDEPENDENT_AMBULATORY_CARE_PROVIDER_SITE_OTHER): Payer: Managed Care, Other (non HMO) | Admitting: Obstetrics and Gynecology

## 2015-05-13 VITALS — BP 130/60 | HR 92 | Resp 16 | Ht 63.0 in | Wt 211.0 lb

## 2015-05-13 DIAGNOSIS — R1032 Left lower quadrant pain: Secondary | ICD-10-CM | POA: Diagnosis not present

## 2015-05-13 DIAGNOSIS — N8184 Pelvic muscle wasting: Secondary | ICD-10-CM

## 2015-05-13 DIAGNOSIS — M6289 Other specified disorders of muscle: Secondary | ICD-10-CM

## 2015-05-13 DIAGNOSIS — N915 Oligomenorrhea, unspecified: Secondary | ICD-10-CM

## 2015-05-13 LAB — TSH: TSH: 1.125 u[IU]/mL (ref 0.350–4.500)

## 2015-05-13 LAB — POCT URINE PREGNANCY: Preg Test, Ur: NEGATIVE

## 2015-05-13 MED ORDER — NAPROXEN SODIUM 550 MG PO TABS: ORAL_TABLET | ORAL | Status: DC

## 2015-05-13 NOTE — Addendum Note (Signed)
Addended by: Shelda JakesHANNER, MARTHA E on: 05/13/2015 03:40 PM   Modules accepted: Orders

## 2015-05-13 NOTE — Progress Notes (Addendum)
Patient ID: Christina Mcdaniel, female   DOB: March 11, 1991, 24 y.o.   MRN: [id number] GYNECOLOGY  VISIT   HPI: 24 y.o.   Single  African American  female   [phone] with Patient's last menstrual period was 03/24/2015.   here c/o left sided pelvic pain. This has been ongoing for over a year. She has a constant low level dull pain in her left lower quadrant. Intermittently the pain gets worse, can be sharp/crampy. The bad pain comes 3 x a month and lasts x 1-2 days. She gets cramps. Not related to her cycle. The pain ranges from a 4-9/10 in severity.  Menses are irregular, coming about every 2 months over the last 6 months. Prior to that it was monthly. Bleeding x 2-8 days, saturates a super tampon in 2 hours. No galactorrhea. She does c/o constipation, currently on medication that is helping. Weight is up and down. No fatigue or hair loss, no visual changes. She has regular headaches, chronic tension for the last 1.5 years. No h/o migraines. Sexually active, intermittent deep dyspareunia, positional. Same partner x 2 years, not using condoms.  She was recently started on medication for depression, feeling better.   GYNECOLOGIC HISTORY: Patient's last menstrual period was 03/24/2015. Contraception:Tubal Ligation, essure Menopausal hormone therapy: N/A She does have a h/o abnormal paps, in 2011 she had an ASCUS/+HPV pap, in 4/12 she had a HSIL pap, colpo with CIN 1, pap from 07/30/13 was negative        OB History    Gravida Para Term Preterm AB TAB SAB Ectopic Multiple Living   3 3 0 3      3         Patient Active Problem List   Diagnosis Date Noted  . Other and unspecified ovarian cyst 07/19/2012  . Heart murmur 03/29/2011  . Chest pain 03/29/2011  . GERD (gastroesophageal reflux disease) 03/29/2011    Past Medical History  Diagnosis Date  . Cardiac murmur   . GERD (gastroesophageal reflux disease)   . Otitis   . Anxiety   . Depression   . Urticaria     Past Surgical History   Procedure Laterality Date  . Essure tubal ligation      Current Outpatient Prescriptions  Medication Sig Dispense Refill  . ibuprofen (ADVIL,MOTRIN) 800 MG tablet Take 1 tablet (800 mg total) by mouth 3 (three) times daily. 21 tablet 0  . Multiple Vitamin (MULTIVITAMIN) tablet Take 1 tablet by mouth daily.      Marland Kitchen. amoxicillin (AMOXIL) 500 MG capsule Take 500 mg by mouth 3 (three) times daily.  0  . hydrOXYzine (ATARAX/VISTARIL) 25 MG tablet     . predniSONE (DELTASONE) 20 MG tablet      No current facility-administered medications for this visit.     ALLERGIES: Review of patient's allergies indicates no known allergies.  Family History  Problem Relation Age of Onset  . Heart murmur Father   . Heart murmur Paternal Grandmother   . Heart failure Paternal Aunt   . Blindness Mother [phone]    Social History   Social History  . Marital Status: Single    Spouse Name: N/A  . Number of Children: N/A  . Years of Education: N/A   Occupational History  . [employer]     Accounts receivable   Social History Main Topics  . Smoking status: Never Smoker   . Smokeless tobacco: Never Used  . Alcohol Use: 0.0 oz/week    0 Standard drinks or  equivalent per week     Comment: once a month   . Drug Use: No  . Sexual Activity:    Partners: Male    Birth Control/ Protection: Surgical   Other Topics Concern  . Not on file   Social History Narrative  Lives with her fiance and kids, kids are 68,7 and 6. Works as a Engineer, production  Review of Systems  Gastrointestinal: Positive for abdominal pain.  Genitourinary:       Left sided pelvic pain   All other systems reviewed and are negative.   PHYSICAL EXAMINATION:    BP 130/60 mmHg  Pulse 92  Resp 16  Ht  (1.6 m)  Wt 211 lb (95.709 kg)  BMI 37.39 kg/m2  LMP 03/24/2015    General appearance: alert, cooperative and appears stated age Neck: no adenopathy, supple, symmetrical, trachea midline and thyroid normal to inspection  and palpation Abdomen: soft, tender in the LLQ, no rebound, no guarding, minimally distended; bowel sounds normal; no masses,  no organomegaly  Pelvic: External genitalia:  no lesions              Urethra:  normal appearing urethra with no masses, tenderness or lesions              Bartholins and Skenes: normal                 Vagina: normal appearing vagina with normal color and discharge, no lesions              Cervix: no lesions              Bimanual Exam:  Uterus:  normal size, contour, position, consistency, mobility, non-tender              Adnexa: no mass, fullness, tenderness and mildly tender in the left adnexa, no masses. No masses or tenderness in the right adnexa.               Rectovaginal: Yes.  .  Confirms.              Anus:  normal sphincter tone, no lesions Bladder: mildly tender to palpation Pelvic floor: bilaterally tender to palpation, L>R  Chaperone was present for exam.  ASSESSMENT 1)LLQ abdominal pain, no change with BM or cycles. Tender on palpation of the left adnexa. Most tender with palpation on the pelvic floor 2) Oligomenorrhea 3)Intermittent deep dyspareunia 4)H/O abnormal paps    PLAN Return for an ultrasound and annual exam with pap Set her up for pelvic floor PT TSH, prolactin UPT Anaprox for pain Genprobe   An After Visit Summary was printed and given to the patient.    Addendum: UPT negative

## 2015-05-14 LAB — GC/CHLAMYDIA PROBE AMP
CT Probe RNA: NEGATIVE
GC Probe RNA: NEGATIVE

## 2015-05-14 LAB — PROLACTIN: Prolactin: 16.7 ng/mL

## 2015-05-18 ENCOUNTER — Telehealth: Payer: Self-pay | Admitting: *Deleted

## 2015-05-18 NOTE — Telephone Encounter (Signed)
-----   Message from Romualdo BolkJill Evelyn Jertson, MD sent at 05/15/2015  9:21 AM EST ----- Please advise the patient of normal results.

## 2015-05-18 NOTE — Telephone Encounter (Signed)
05-19-15 LMTC in regards to lab results -eh

## 2015-05-20 NOTE — Telephone Encounter (Signed)
I spoke with patient and she is aware of her lab results

## 2015-05-26 ENCOUNTER — Ambulatory Visit (INDEPENDENT_AMBULATORY_CARE_PROVIDER_SITE_OTHER): Payer: Managed Care, Other (non HMO) | Admitting: Obstetrics and Gynecology

## 2015-05-26 ENCOUNTER — Ambulatory Visit (INDEPENDENT_AMBULATORY_CARE_PROVIDER_SITE_OTHER): Payer: Managed Care, Other (non HMO)

## 2015-05-26 ENCOUNTER — Ambulatory Visit: Payer: Managed Care, Other (non HMO) | Admitting: Obstetrics and Gynecology

## 2015-05-26 ENCOUNTER — Encounter: Payer: Self-pay | Admitting: Obstetrics and Gynecology

## 2015-05-26 VITALS — BP 112/80 | HR 80 | Resp 16 | Ht 63.0 in | Wt 219.0 lb

## 2015-05-26 DIAGNOSIS — E663 Overweight: Secondary | ICD-10-CM | POA: Diagnosis not present

## 2015-05-26 DIAGNOSIS — N926 Irregular menstruation, unspecified: Secondary | ICD-10-CM

## 2015-05-26 DIAGNOSIS — N83202 Unspecified ovarian cyst, left side: Secondary | ICD-10-CM

## 2015-05-26 DIAGNOSIS — R1032 Left lower quadrant pain: Secondary | ICD-10-CM

## 2015-05-26 DIAGNOSIS — Z01419 Encounter for gynecological examination (general) (routine) without abnormal findings: Secondary | ICD-10-CM | POA: Diagnosis not present

## 2015-05-26 DIAGNOSIS — Z124 Encounter for screening for malignant neoplasm of cervix: Secondary | ICD-10-CM | POA: Diagnosis not present

## 2015-05-26 DIAGNOSIS — L680 Hirsutism: Secondary | ICD-10-CM

## 2015-05-26 LAB — LIPID PANEL
CHOL/HDL RATIO: 3.4 ratio (ref <=5.0)
Cholesterol: 153 mg/dL (ref 125–200)
HDL: 45 mg/dL — AB (ref >=46)
LDL Cholesterol: 61 mg/dL (ref <130)
Triglycerides: 233 mg/dL — ABNORMAL HIGH (ref <150)
VLDL: 47 mg/dL — ABNORMAL HIGH (ref <30)

## 2015-05-26 LAB — HEMOGLOBIN A1C
Hgb A1c MFr Bld: 5.3 % (ref <5.7)
MEAN PLASMA GLUCOSE: 105 mg/dL (ref <117)

## 2015-05-26 MED ORDER — DROSPIRENONE-ETHINYL ESTRADIOL 3-0.02 MG PO TABS: 1.0000 | ORAL_TABLET | Freq: Every day | ORAL | Status: DC

## 2015-05-26 MED ORDER — MEDROXYPROGESTERONE ACETATE 5 MG PO TABS: ORAL_TABLET | ORAL | Status: DC

## 2015-05-26 NOTE — Patient Instructions (Signed)
Oral Contraception Information Oral contraceptive pills (OCPs) are medicines taken to prevent pregnancy. OCPs work by preventing the ovaries from releasing eggs. The hormones in OCPs also cause the cervical mucus to thicken, preventing the sperm from entering the uterus. The hormones also cause the uterine lining to become thin, not allowing a fertilized egg to attach to the inside of the uterus. OCPs are highly effective when taken exactly as prescribed. However, OCPs do not prevent sexually transmitted diseases (STDs). Safe sex practices, such as using condoms along with the pill, can help prevent STDs.  Before taking the pill, you may have a physical exam and Pap test. Your health care provider may order blood tests. The health care provider will make sure you are a good candidate for oral contraception. Discuss with your health care provider the possible side effects of the OCP you may be prescribed. When starting an OCP, it can take 2 to 3 months for the body to adjust to the changes in hormone levels in your body.  TYPES OF ORAL CONTRACEPTION  The combination pill--This pill contains estrogen and progestin (synthetic progesterone) hormones. The combination pill comes in 21-day, 28-day, or 91-day packs. Some types of combination pills are meant to be taken continuously (365-day pills). With 21-day packs, you do not take pills for 7 days after the last pill. With 28-day packs, the pill is taken every day. The last 7 pills are without hormones. Certain types of pills have more than 21 hormone-containing pills. With 91-day packs, the first 84 pills contain both hormones, and the last 7 pills contain no hormones or contain estrogen only.  The minipill--This pill contains the progesterone hormone only. The pill is taken every day continuously. It is very important to take the pill at the same time each day. The minipill comes in packs of 28 pills. All 28 pills contain the hormone.  ADVANTAGES OF ORAL  CONTRACEPTIVE PILLS  Decreases premenstrual symptoms.   Treats menstrual period cramps.   Regulates the menstrual cycle.   Decreases a heavy menstrual flow.   May treatacne, depending on the type of pill.   Treats abnormal uterine bleeding.   Treats polycystic ovarian syndrome.   Treats endometriosis.   Can be used as emergency contraception.  THINGS THAT CAN MAKE ORAL CONTRACEPTIVE PILLS LESS EFFECTIVE OCPs can be less effective if:   You forget to take the pill at the same time every day.   You have a stomach or intestinal disease that lessens the absorption of the pill.   You take OCPs with other medicines that make OCPs less effective, such as antibiotics, certain HIV medicines, and some seizure medicines.   You take expired OCPs.   You forget to restart the pill on day 7, when using the packs of 21 pills.  RISKS ASSOCIATED WITH ORAL CONTRACEPTIVE PILLS  Oral contraceptive pills can sometimes cause side effects, such as:  Headache.  Nausea.  Breast tenderness.  Irregular bleeding or spotting. Combination pills are also associated with a small increased risk of:  Blood clots.  Heart attack.  Stroke.   This information is not intended to replace advice given to you by your health care provider. Make sure you discuss any questions you have with your health care provider.   Document Released: 09/10/2002 Document Revised: 04/10/2013 Document Reviewed: 12/09/2012 Elsevier Interactive Patient Education 2016 Else[employer] Inc.  EXERCISE AND DIET:  We recommended that you start or continue a regular exercise program for good health. Regular exercise means any   activity that makes your heart beat faster and makes you sweat.  We recommend exercising at least 30 minutes per day at least 3 days a week, preferably 4 or 5.  We also recommend a diet low in fat and sugar.  Inactivity, poor dietary choices and obesity can cause diabetes, heart attack, stroke, and  kidney damage, among others.    ALCOHOL AND SMOKING:  Women should limit their alcohol intake to no more than 7 drinks/beers/glasses of wine (combined, not each!) per week. Moderation of alcohol intake to this level decreases your risk of breast cancer and liver damage. And of course, no recreational drugs are part of a healthy lifestyle.  And absolutely no smoking or even second hand smoke. Most people know smoking can cause heart and lung diseases, but did you know it also contributes to weakening of your bones? Aging of your skin?  Yellowing of your teeth and nails?  CALCIUM AND VITAMIN D:  Adequate intake of calcium and Vitamin D are recommended.  The recommendations for exact amounts of these supplements seem to change often, but generally speaking 600 mg of calcium (either carbonate or citrate) and 800 units of Vitamin D per day seems prudent. Certain women may benefit from higher intake of Vitamin D.  If you are among these women, your doctor will have told you during your visit.    PAP SMEARS:  Pap smears, to check for cervical cancer or precancers,  have traditionally been done yearly, although recent scientific advances have shown that most women can have pap smears less often.  However, every woman still should have a physical exam from her gynecologist every year. It will include a breast check, inspection of the vulva and vagina to check for abnormal growths or skin changes, a visual exam of the cervix, and then an exam to evaluate the size and shape of the uterus and ovaries.  And after 25 years of age, a rectal exam is indicated to check for rectal cancers. We will also provide age appropriate advice regarding health maintenance, like when you should have certain vaccines, screening for sexually transmitted diseases, bone density testing, colonoscopy, mammograms, etc.   MAMMOGRAMS:  All women over 40 years old should have a yearly mammogram. Many facilities now offer a "3D" mammogram, which  may cost around $50 extra out of pocket. If possible,  we recommend you accept the option to have the 3D mammogram performed.  It both reduces the number of women who will be called back for extra views which then turn out to be normal, and it is better than the routine mammogram at detecting truly abnormal areas.    COLONOSCOPY:  Colonoscopy to screen for colon cancer is recommended for all women at age 50.  We know, you hate the idea of the prep.  We agree, BUT, having colon cancer and not knowing it is worse!!  Colon cancer so often starts as a polyp that can be seen and removed at colonscopy, which can quite literally save your life!  And if your first colonoscopy is normal and you have no family history of colon cancer, most women don't have to have it again for 10 years.  Once every ten years, you can do something that may end up saving your life, right?  We will be happy to help you get it scheduled when you are ready.  Be sure to check your insurance coverage so you understand how much it will cost.  It may be covered   as a preventative service at no cost, but you should check your particular policy.       

## 2015-05-26 NOTE — Progress Notes (Signed)
24 y.o. [id number] SingleAfrican AmericanF here for annual exam.  The patient was seen a few weeks ago c/o a 1 year h/o LLQ abdominal pain and irregular menses.  Over the last year her cycles have been every 2-3 months x 2-8 days. Saturating a super tampon in 2 hours. No BTB. Cramps are bad. Functions with difficulty with her cramps. Sexually active, same partner x 2 years. She has an appointment to see PT at the end of this month for pelvic floor PT (dyspareunia and pelvic floor tenderness). She has an essure tubal ligation.  She has some issues with hair growth on her face and chin, as well as her breast and abdomen.  Period Duration (Days): 2-7 days  Period Pattern: (!) Irregular Menstrual Flow: Moderate, Heavy Menstrual Control: Maxi pad, Tampon Dysmenorrhea: (!) Severe Dysmenorrhea Symptoms: Cramping  Patient's last menstrual period was 04/01/2015.          Sexually active: Yes.    The current method of family planning is Esure.    Exercising: Yes.    cardio Smoker:  no  Health Maintenance: Pap:  07-30-13 WNL History of abnormal Pap:  Yes 2014  TDaP:  Unsure, thinks UTD Gardasil: completed all three   reports that she has never smoked. She has never used smokeless tobacco. She reports that she drinks alcohol. She reports that she does not use illicit drugs. Kids are 9, 7 and 6. Works as a Production assistant, radio.   Past Medical History  Diagnosis Date  . Cardiac murmur   . GERD (gastroesophageal reflux disease)   . Otitis   . Anxiety   . Depression   . Urticaria     Past Surgical History  Procedure Laterality Date  . Essure tubal ligation      Current Outpatient Prescriptions  Medication Sig Dispense Refill  . hydrOXYzine (ATARAX/VISTARIL) 25 MG tablet     . ibuprofen (ADVIL,MOTRIN) 800 MG tablet Take 1 tablet (800 mg total) by mouth 3 (three) times daily. 21 tablet 0  . Linaclotide (LINZESS) 145 MCG CAPS capsule Take 145 mcg by mouth daily.    . Multiple  Vitamin (MULTIVITAMIN) tablet Take 1 tablet by mouth daily.      . naproxen sodium (ANAPROX DS) 550 MG tablet 1 tab po q 12 hours prn pain 30 tablet 2  . fexofenadine (ALLEGRA) 180 MG tablet     . TRINTELLIX 20 MG TABS TK 1 T PO D  1   No current facility-administered medications for this visit.    Family History  Problem Relation Age of Onset  . Heart murmur Father   . Heart murmur Paternal Grandmother   . Heart failure Paternal Aunt   . Blindness Mother 9    Review of Systems  Constitutional: Positive for unexpected weight change.  Eyes: Negative.   Respiratory: Negative.   Cardiovascular: Negative.   Gastrointestinal: Negative.   Endocrine: Negative.   Genitourinary: Positive for menstrual problem.       Pain or bleeding with intercourse  Painful periods irregular menstrual cycles   Musculoskeletal: Negative.   Skin: Positive for rash.       Itchy rash  Allergic/Immunologic: Negative.   Neurological: Positive for headaches.  Psychiatric/Behavioral: Negative.        Depression   Depression/anxiety controlled on medication. Headaches are tension headaches.   Exam:   BP 112/80 mmHg  Pulse 80  Resp 16  Ht  (1.6 m)  Wt 219 lb (  99.338 kg)  BMI 38.80 kg/m2  LMP 04/01/2015  Weight change: @WEIGHTCHANGE @ Height:   Height: 5\' 3"  (160 cm)  Ht Readings from Last 3 Encounters:  05/26/15 5\' 3"  (1.6 m)  05/13/15 5\' 3"  (1.6 m)  07/30/13 5\' 3"  (1.6 m)    General appearance: alert, cooperative and appears stated age Head: Normocephalic, without obvious abnormality, atraumatic Neck: no adenopathy, supple, symmetrical, trachea midline and thyroid normal to inspection and palpation Lungs: clear to auscultation bilaterally Breasts: normal appearance, no masses or tenderness Heart: regular rate and rhythm, 2/6 SEM, loudest at LSB Abdomen: soft, non-tender; bowel sounds normal; no masses,  no organomegaly Extremities: extremities normal, atraumatic, no cyanosis or  edema Skin: Skin color, texture, turgor normal. No rashes or lesions, no significant hirsutism was noted (patient removes) Lymph nodes: Cervical, supraclavicular, and axillary nodes normal. No abnormal inguinal nodes palpated Neurologic: Grossly normal   Pelvic: External genitalia:  no lesions              Urethra:  normal appearing urethra with no masses, tenderness or lesions              Bartholins and Skenes: normal                 Vagina: normal appearing vagina with normal color and discharge, no lesions              Cervix: no lesions               Bimanual Exam:  Uterus:  normal size, contour, position, consistency, mobility, non-tender              Adnexa: no mass, fullness, tenderness    Pelvic floor: not tender today               Rectovaginal: Confirms               Anus:  normal sphincter tone, no lesions  Chaperone was present for exam.  Ultrasound images reviewed with the patient  A:  Well Woman with normal exam  LLQ abdominal pain, currently feeling better, previously with pelvic floor tenderness, not today  Complex left ovarian cyst, likely hemorrhagic CL  Irregular menses, normal TSH/prolactin, suspect PCOS  Hirsutism  Heart murmur, the patient reports having had a normal echo.  P:   Pap with reflex HPV  Recent negative genprobe  Hirsutism labs  Start Yaz, risks reviewed, including the increased risk of clotting  HgA1C, lipids (not fasting)  F/U ultrasound 2 month and pill check  Hold on pelvic floor PT for now. Will discuss at her f/u visit   In addition to the annual exam, over 10 minutes was spent face to face, over 50% in counseling

## 2015-05-27 ENCOUNTER — Other Ambulatory Visit: Payer: Self-pay | Admitting: Obstetrics and Gynecology

## 2015-05-27 DIAGNOSIS — E781 Pure hyperglyceridemia: Secondary | ICD-10-CM

## 2015-05-27 LAB — DHEA-SULFATE: DHEA SO4: 61 ug/dL (ref 18–391)

## 2015-05-27 LAB — TESTOSTERONE, FREE, TOTAL, SHBG
Sex Hormone Binding: 31 nmol/L (ref 17–124)
TESTOSTERONE-% FREE: 1.9 % (ref 0.4–2.4)
Testosterone, Free: 7.8 pg/mL — ABNORMAL HIGH (ref 0.6–6.8)
Testosterone: 42 ng/dL (ref 10–70)

## 2015-05-29 LAB — IPS PAP TEST WITH REFLEX TO HPV

## 2015-05-31 LAB — 17-HYDROXYPROGESTERONE: 17-OH-PROGESTERONE, LC/MS/MS: 28 ng/dL

## 2015-07-21 ENCOUNTER — Other Ambulatory Visit: Payer: Self-pay | Admitting: Obstetrics and Gynecology

## 2015-07-21 ENCOUNTER — Ambulatory Visit (INDEPENDENT_AMBULATORY_CARE_PROVIDER_SITE_OTHER): Payer: Managed Care, Other (non HMO)

## 2015-07-21 ENCOUNTER — Encounter: Payer: Self-pay | Admitting: Obstetrics and Gynecology

## 2015-07-21 ENCOUNTER — Ambulatory Visit (INDEPENDENT_AMBULATORY_CARE_PROVIDER_SITE_OTHER): Payer: Managed Care, Other (non HMO) | Admitting: Obstetrics and Gynecology

## 2015-07-21 VITALS — BP 122/78 | HR 68 | Resp 14 | Wt 217.0 lb

## 2015-07-21 DIAGNOSIS — E282 Polycystic ovarian syndrome: Secondary | ICD-10-CM | POA: Diagnosis not present

## 2015-07-21 DIAGNOSIS — N83209 Unspecified ovarian cyst, unspecified side: Secondary | ICD-10-CM | POA: Diagnosis not present

## 2015-07-21 DIAGNOSIS — N915 Oligomenorrhea, unspecified: Secondary | ICD-10-CM

## 2015-07-21 DIAGNOSIS — N941 Unspecified dyspareunia: Secondary | ICD-10-CM | POA: Diagnosis not present

## 2015-07-21 DIAGNOSIS — N946 Dysmenorrhea, unspecified: Secondary | ICD-10-CM | POA: Diagnosis not present

## 2015-07-21 DIAGNOSIS — N83202 Unspecified ovarian cyst, left side: Secondary | ICD-10-CM | POA: Diagnosis not present

## 2015-07-21 MED ORDER — MEDROXYPROGESTERONE ACETATE 5 MG PO TABS: ORAL_TABLET | ORAL | Status: DC

## 2015-07-21 NOTE — Progress Notes (Signed)
GYNECOLOGY  VISIT   HPI: 25 y.o.   Single  African American  female   [phone] with Patient's last menstrual period was 05/27/2015.   here for a pelvic ultrasound. The patient period started on 11/23 to December 13. She started OCP's stopped it on 12/13 because of the bleeding and having leg cramps. No bleeding since then. She is sexually active, has essure for contraception. Her pain is mostly better, still in the LLQ, but just with her cycle. She hasn't had a cycle since the cycle in November. The pain with intercourse is still happening, occurs less than half the time. Changing positions helps. Cramps with her cycle are bad, she has trouble functioning for up to 4 days. Anaprox helps some. She didn't like a mirena.   She is struggling with depression, has been on multiple meds. She has a psychiatrist, she feels she needs to find a new one.  GYNECOLOGIC HISTORY: Patient's last menstrual period was 05/27/2015. Contraception:OCP Menopausal hormone therapy: None        OB History    Gravida Para Term Preterm AB TAB SAB Ectopic Multiple Living   3 3 0 3      3         Patient Active Problem List   Diagnosis Date Noted  . Other and unspecified ovarian cyst 07/19/2012  . Heart murmur 03/29/2011  . Chest pain 03/29/2011  . GERD (gastroesophageal reflux disease) 03/29/2011    Past Medical History  Diagnosis Date  . Cardiac murmur   . GERD (gastroesophageal reflux disease)   . Otitis   . Anxiety   . Depression   . Urticaria     Past Surgical History  Procedure Laterality Date  . Essure tubal ligation      Current Outpatient Prescriptions  Medication Sig Dispense Refill  . drospirenone-ethinyl estradiol (YAZ,GIANVI,LORYNA) 3-0.02 MG tablet Take 1 tablet by mouth daily. Start on the first day of your cycle after taking the 3 Package 0  . fexofenadine (ALLEGRA) 180 MG tablet     . hydrOXYzine (ATARAX/VISTARIL) 25 MG tablet     . ibuprofen (ADVIL,MOTRIN) 800 MG tablet Take 1  tablet (800 mg total) by mouth 3 (three) times daily. 21 tablet 0  . Linaclotide (LINZESS) 145 MCG CAPS capsule Take 145 mcg by mouth daily.    . Multiple Vitamin (MULTIVITAMIN) tablet Take 1 tablet by mouth daily.      . naproxen sodium (ANAPROX DS) 550 MG tablet 1 tab po q 12 hours prn pain 30 tablet 2   No current facility-administered medications for this visit.     ALLERGIES: Review of patient's allergies indicates no known allergies.  Family History  Problem Relation Age of Onset  . Heart murmur Father   . Heart murmur Paternal Grandmother   . Heart failure Paternal Aunt   . Blindness Mother 61    Social History   Social History  . Marital Status: Single    Spouse Name: N/A  . Number of Children: N/A  . Years of Education: N/A   Occupational History  . [employer]     Accounts receivable   Social History Main Topics  . Smoking status: Never Smoker   . Smokeless tobacco: Never Used  . Alcohol Use: 0.0 oz/week    0 Standard drinks or equivalent per week     Comment: once a month   . Drug Use: No  . Sexual Activity:    Partners: Male    Birth  Control/ Protection: Surgical   Other Topics Concern  . Not on file   Social History Narrative    Review of Systems  Constitutional: Negative.   HENT: Negative.   Eyes: Negative.   Respiratory: Negative.   Cardiovascular: Negative.   Gastrointestinal: Negative.   Genitourinary:       Irregular menstrual cycles Painful periods   Musculoskeletal: Negative.   Skin: Negative.   Neurological: Negative.   Endo/Heme/Allergies: Negative.   Psychiatric/Behavioral: Negative.     PHYSICAL EXAMINATION:    BP 122/78 mmHg  Pulse 68  Resp 14  Wt 217 lb (98.431 kg)  LMP 05/27/2015    General appearance: alert, cooperative and appears stated age  ASSESSMENT Oligomenorrhea, PCOS Elevated lipids, normal HgbA1C Dysmenorrhea, didn't like Yaz or the mirena IUD, doesn't need contraception. Discussed trying a different pill vs  cyclic provera Dyspareunia, tolerable with position change    PLAN Cyclic provera Continue Anaprox for cramps She is working on weight loss, exercising and eating healthier Needs f/u lipids in about 4 months   An After Visit Summary was printed and given to the patient.    Over 15 minutes was spent face to face with the patient, over 50% in counseling

## 2015-07-22 ENCOUNTER — Encounter: Payer: Self-pay | Admitting: Obstetrics and Gynecology

## 2015-11-24 ENCOUNTER — Other Ambulatory Visit: Payer: Self-pay

## 2015-11-24 ENCOUNTER — Telehealth: Payer: Self-pay | Admitting: Obstetrics and Gynecology

## 2015-11-24 NOTE — Telephone Encounter (Signed)
Left message to call and reschedule lab appointment. °

## 2015-12-02 ENCOUNTER — Encounter: Payer: Self-pay | Admitting: Obstetrics and Gynecology

## 2016-01-06 ENCOUNTER — Other Ambulatory Visit: Payer: Self-pay

## 2016-01-06 MED ORDER — NAPROXEN SODIUM 550 MG PO TABS
ORAL_TABLET | ORAL | Status: DC
Start: 2016-01-06 — End: 2016-09-15

## 2016-01-06 NOTE — Telephone Encounter (Signed)
Medication refill request: Naproxen Sodium 550mg  Last AEX:  05/26/15 JJ Next AEX: 06/01/16 Last MMG (if hormonal medication request): n/a Refill authorized: 05/13/15 #30 w/2 refills; today please advise

## 2016-01-14 ENCOUNTER — Telehealth: Payer: Self-pay | Admitting: Obstetrics and Gynecology

## 2016-01-14 NOTE — Telephone Encounter (Signed)
Patient called and said, "I am having pelvic pain more towards the left side. I don't know if it is a cyst or what. I'd like to come in next week for a visit."   Last seen 07/11/15.

## 2016-01-14 NOTE — Telephone Encounter (Signed)
Spoke with patient. Patient states that she has been experiencing left sided pelvic pain for 2-3 months. States it has been increasingly getting worse over the last week or two. Reports the pain is a constant dull pain. Intermittently she will have sharp pain. Pain worsens at night. Reports pain is a 8/10 daily. She is taking Naproxen and that reduces pain to a 6-7/10. Reports she has taking Naproxen today and is feeling slightly improved. Denies any fever, chills, nausea, vomiting, change in diet or BM. She started her menses today. Reports bleeding is "normal." She is not on any form of birth control. Advised she will need to be seen in the office for further evaluation. Offered appointment for today, but patient declines due to work schedule. She is requesting an appointment for next Tuesday. Appointment scheduled for 01/19/2016 at 10:30 am with Dr.Jertson. She is agreeable to date and time. Advised if pain worsens or she develops new symptoms she will need to be seen in the office earlier or with a local urgent care or ER. She is agreeable.  Routing to provider for final review. Patient agreeable to disposition. Will close encounter.

## 2016-01-19 ENCOUNTER — Ambulatory Visit (INDEPENDENT_AMBULATORY_CARE_PROVIDER_SITE_OTHER): Payer: Managed Care, Other (non HMO) | Admitting: Obstetrics and Gynecology

## 2016-01-19 ENCOUNTER — Encounter: Payer: Self-pay | Admitting: Obstetrics and Gynecology

## 2016-01-19 VITALS — BP 112/70 | HR 80 | Resp 14 | Wt 218.0 lb

## 2016-01-19 DIAGNOSIS — R5383 Other fatigue: Secondary | ICD-10-CM

## 2016-01-19 DIAGNOSIS — N941 Unspecified dyspareunia: Secondary | ICD-10-CM

## 2016-01-19 DIAGNOSIS — N946 Dysmenorrhea, unspecified: Secondary | ICD-10-CM

## 2016-01-19 DIAGNOSIS — R102 Pelvic and perineal pain: Secondary | ICD-10-CM

## 2016-01-19 DIAGNOSIS — E663 Overweight: Secondary | ICD-10-CM

## 2016-01-19 DIAGNOSIS — R1032 Left lower quadrant pain: Secondary | ICD-10-CM

## 2016-01-19 DIAGNOSIS — N920 Excessive and frequent menstruation with regular cycle: Secondary | ICD-10-CM | POA: Diagnosis not present

## 2016-01-19 LAB — POCT URINALYSIS DIPSTICK
Bilirubin, UA: NEGATIVE
Blood, UA: NEGATIVE
Glucose, UA: NEGATIVE
KETONES UA: NEGATIVE
LEUKOCYTES UA: NEGATIVE
Nitrite, UA: NEGATIVE
PH UA: 7
PROTEIN UA: NEGATIVE
Urobilinogen, UA: NEGATIVE

## 2016-01-19 LAB — CBC
HCT: 41.7 % (ref 35.0–45.0)
Hemoglobin: 13.6 g/dL (ref 11.7–15.5)
MCH: 30.2 pg (ref 27.0–33.0)
MCHC: 32.6 g/dL (ref 32.0–36.0)
MCV: 92.5 fL (ref 80.0–100.0)
MPV: 9.8 fL (ref 7.5–12.5)
PLATELETS: 309 10*3/uL (ref 140–400)
RBC: 4.51 MIL/uL (ref 3.80–5.10)
RDW: 13.8 % (ref 11.0–15.0)
WBC: 5.5 10*3/uL (ref 3.8–10.8)

## 2016-01-19 LAB — POCT URINE PREGNANCY: Preg Test, Ur: NEGATIVE

## 2016-01-19 LAB — FERRITIN: Ferritin: 75 ng/mL (ref 10–154)

## 2016-01-19 LAB — TSH: TSH: 0.91 mIU/L

## 2016-01-19 NOTE — Progress Notes (Signed)
GYNECOLOGY  VISIT   HPI: 25 y.o.   Married  [place]  female   [phone] with Patient's last menstrual period was 01/14/2016.   here for left sided pelvic pain X 4 months. The pain is constant worse at night. The pain is a dull, aching, pulsating pain. Sometime sharp components. Pain ranges from a 7-10/10 in severity. Mainly in the LLQ, but can be in the RLQ. Intermittent deep dyspareunia, positional. The pain is worse at night, worse on her cycle.  Since she started metformin several months ago her cycles have been monthly. Started on it for PCOS by her primary. Cycles q month x 7 days. Saturating a super plus tampon in 2 hours. Cramps are bad, naproxen helps a little.  No fevers, nausea, vomiting, diarrhea, constipation. She has a BM 2 x a week, on metformin BM are daily and that doesn't change her pain. She temporarily stopped taking her metformin, is restarting.  The patient has a h/o oligomenorrhea and PCOS. She has an essure for contraception. Didn't tolerate OCP's of the mirena IUD for cycle control and treatment of dysmenorrhea.  Can't loose weight, c/o fatigue.  She just got married last month and is here with her husband.  GYNECOLOGIC HISTORY: Patient's last menstrual period was 01/14/2016. Contraception: e-sure Menopausal hormone therapy: none        OB History    Gravida Para Term Preterm AB TAB SAB Ectopic Multiple Living   3 3 0 3      3         Patient Active Problem List   Diagnosis Date Noted  . Other and unspecified ovarian cyst 07/19/2012  . Heart murmur 03/29/2011  . Chest pain 03/29/2011  . GERD (gastroesophageal reflux disease) 03/29/2011    Past Medical History  Diagnosis Date  . Cardiac murmur   . GERD (gastroesophageal reflux disease)   . Otitis   . Anxiety   . Depression   . Urticaria   . PCOS (polycystic ovarian syndrome)     Past Surgical History  Procedure Laterality Date  . Essure tubal ligation      Current Outpatient Prescriptions   Medication Sig Dispense Refill  . fexofenadine (ALLEGRA) 180 MG tablet     . hydrOXYzine (ATARAX/VISTARIL) 25 MG tablet     . ibuprofen (ADVIL,MOTRIN) 800 MG tablet Take 1 tablet (800 mg total) by mouth 3 (three) times daily. 21 tablet 0  . metFORMIN (GLUMETZA) 500 MG (MOD) 24 hr tablet Take 500 mg by mouth 2 (two) times daily with a meal.    . Multiple Vitamin (MULTIVITAMIN) tablet Take 1 tablet by mouth daily.      . naproxen sodium (ANAPROX DS) 550 MG tablet 1 tab po q 12 hours prn pain 30 tablet 2   No current facility-administered medications for this visit.     ALLERGIES: Review of patient's allergies indicates no known allergies.  Family History  Problem Relation Age of Onset  . Heart murmur Father   . Heart murmur Paternal Grandmother   . Heart failure Paternal Aunt   . Blindness Mother 69    Social History   Social History  . Marital Status: Married    Spouse Name: N/A  . Number of Children: N/A  . Years of Education: N/A   Occupational History  . [employer]     Accounts receivable   Social History Main Topics  . Smoking status: Never Smoker   . Smokeless tobacco: Never Used  . Alcohol Use:  0.0 oz/week    0 Standard drinks or equivalent per week     Comment: once a month   . Drug Use: No  . Sexual Activity:    Partners: Male    Birth Control/ Protection: Surgical   Other Topics Concern  . Not on file   Social History Narrative    Review of Systems  Constitutional: Negative.   HENT: Negative.   Eyes: Negative.   Respiratory: Negative.   Cardiovascular: Negative.   Gastrointestinal: Negative.   Genitourinary:       Left sided pelvic pain Bloating Painful periods  Musculoskeletal: Negative.   Skin: Negative.   Neurological: Negative.   Endo/Heme/Allergies: Negative.   Psychiatric/Behavioral: Negative.     PHYSICAL EXAMINATION:    BP 112/70 mmHg  Pulse 80  Resp 14  Wt 218 lb (98.884 kg)  LMP 01/14/2016    General appearance: alert,  cooperative and appears stated age Neck: no adenopathy, supple, symmetrical, trachea midline and thyroid normal to inspection and palpation Abdomen: soft, mildly tender in the LLQ (in the region of the descending colon); no rebound, no guarding; no masses,  no organomegaly, mildly distended. Less tender with abdominal exam over tensed muscles.   Pelvic: External genitalia:  no lesions              Urethra:  normal appearing urethra with no masses, tenderness or lesions              Bartholins and Skenes: normal                 Vagina: normal appearing vagina with normal color and discharge, no lesions              Cervix: no cervical motion tenderness and no lesions              Bimanual Exam:  Uterus:  small, anteverted, mobile, mild uterine tenderness.               Adnexa: no masses, tender in the left adnexa              Rectovaginal: Yes.  .  Confirms.              Anus:  normal sphincter tone, no lesions Pelvic floor: not tender Bladder: tender to palpation (more tender than the uterus)  Chaperone was present for exam.  ASSESSMENT Abdominal/pelvic pain Fatigue Menorrhagia Dysmenorrhea Dyspareunia Difficulty with weight loss    PLAN upt negative Negative urine dip CBC, Ferritin, TSH Return for a pelvic ultrasound Discussed weight loss, exercise, changing eating habits, discussed weight watchers   An After Visit Summary was printed and given to the patient.

## 2016-01-20 LAB — GC/CHLAMYDIA PROBE AMP
CT Probe RNA: NOT DETECTED
GC Probe RNA: NOT DETECTED

## 2016-01-25 ENCOUNTER — Telehealth: Payer: Self-pay | Admitting: Obstetrics and Gynecology

## 2016-01-25 NOTE — Telephone Encounter (Signed)
Patients ultrasound was cancelled by our office for scheduling reasons. Patient understood but unsure if available the following ultrasound date and time due to her job. States she will contact us with availability once she speaks with her employer. Aware to ask for [place] or Thomasene Lot when returns call.  Routing to [place] for review   [employer] for tracking

## 2016-01-26 ENCOUNTER — Other Ambulatory Visit: Payer: Managed Care, Other (non HMO)

## 2016-01-26 ENCOUNTER — Other Ambulatory Visit: Payer: Managed Care, Other (non HMO) | Admitting: Obstetrics and Gynecology

## 2016-02-04 NOTE — Telephone Encounter (Signed)
Call to patient. Per DPR, can leave message on [phone]. Left message calling to assist in rescheduling and appointment. Call back and speak to triage nurse.

## 2016-02-08 NOTE — Telephone Encounter (Signed)
Call to patient. Left message to call back for triage nurse. Calling to assist in rescheduling appointment.

## 2016-04-07 ENCOUNTER — Encounter: Payer: Self-pay | Admitting: Obstetrics and Gynecology

## 2016-04-17 ENCOUNTER — Other Ambulatory Visit: Payer: Self-pay | Admitting: Obstetrics and Gynecology

## 2016-04-18 NOTE — Telephone Encounter (Signed)
Please check how often the patient is taking the Anaprox. He had a script sent in for 30 tablets with 2 refills in 7/17. She shouldn't be taking this every day. Will call in another 30 tablets for now.

## 2016-04-18 NOTE — Telephone Encounter (Signed)
Medication refill request: Anaprox  Last AEX:  05-26-15  Next AEX: 06-01-16 Last MMG (if hormonal medication request): N/A Refill authorized: please advise

## 2016-04-20 NOTE — Telephone Encounter (Signed)
Left patient a message for patient to call regarding RX request -eh

## 2016-04-21 ENCOUNTER — Telehealth: Payer: Self-pay | Admitting: Obstetrics and Gynecology

## 2016-04-21 NOTE — Telephone Encounter (Signed)
Patient says she is returning your call.

## 2016-04-21 NOTE — Telephone Encounter (Signed)
Spoke with patient and she is taking the RX twice a day for her LLQ pain. She has not been able to cone for her U/S because she can't get off work. Patient informed that she should not be taking this everyday. She is asking what she can daily for her pain. Sending to JJ for review    Dr. Salli QuarryJertson's message  Please check how often the patient is taking the Anaprox. He had a script sent in for 30 tablets with 2 refills in 7/17. She shouldn't be taking this every day. Will call in another 30 tablets for now

## 2016-04-21 NOTE — Telephone Encounter (Signed)
Patient called back, She was confused about which medication we were discussing with the previous phone call. She is taking the metformin BID not the Anaprox. She is only taking the Anaprox PRN menstrual pain. She said that she refills were on automatic refill with her pharmacy that why we received there refill request.

## 2016-04-21 NOTE — Telephone Encounter (Signed)
Will close encounter

## 2016-04-25 ENCOUNTER — Encounter (HOSPITAL_COMMUNITY): Payer: Self-pay | Admitting: Emergency Medicine

## 2016-04-25 ENCOUNTER — Emergency Department (HOSPITAL_COMMUNITY): Payer: Managed Care, Other (non HMO)

## 2016-04-25 ENCOUNTER — Emergency Department (HOSPITAL_COMMUNITY)
Admission: EM | Admit: 2016-04-25 | Discharge: 2016-04-26 | Disposition: A | Discharge status: Home / Self Care | Payer: Managed Care, Other (non HMO) | Attending: Emergency Medicine | Admitting: Emergency Medicine

## 2016-04-25 DIAGNOSIS — Z7984 Long term (current) use of oral hypoglycemic drugs: Secondary | ICD-10-CM | POA: Insufficient documentation

## 2016-04-25 DIAGNOSIS — Y999 Unspecified external cause status: Secondary | ICD-10-CM | POA: Diagnosis not present

## 2016-04-25 DIAGNOSIS — Y92410 Unspecified street and highway as the place of occurrence of the external cause: Secondary | ICD-10-CM | POA: Insufficient documentation

## 2016-04-25 DIAGNOSIS — R079 Chest pain, unspecified: Secondary | ICD-10-CM | POA: Diagnosis not present

## 2016-04-25 DIAGNOSIS — Y939 Activity, unspecified: Secondary | ICD-10-CM | POA: Diagnosis not present

## 2016-04-25 DIAGNOSIS — Z79899 Other long term (current) drug therapy: Secondary | ICD-10-CM | POA: Insufficient documentation

## 2016-04-25 LAB — POC URINE PREG, ED: PREG TEST UR: NEGATIVE

## 2016-04-25 MED ORDER — NAPROXEN 375 MG PO TABS
375.0000 mg | ORAL_TABLET | Freq: Once | ORAL | Status: AC
  Administered 2016-04-25: 375 mg via ORAL
  Filled 2016-04-25: qty 1

## 2016-04-25 MED ORDER — CYCLOBENZAPRINE HCL 10 MG PO TABS
5.0000 mg | ORAL_TABLET | Freq: Once | ORAL | Status: AC
  Administered 2016-04-25: 5 mg via ORAL
  Filled 2016-04-25: qty 1
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
## 2016-04-25 NOTE — ED Notes (Signed)
Bed: [facility] Expected date:  Expected time:  Means of arrival:  Comments: 

## 2016-04-26 MED ORDER — CYCLOBENZAPRINE HCL 10 MG PO TABS: 5.0000 mg | ORAL_TABLET | Freq: Two times a day (BID) | ORAL | 0 refills | Status: DC | PRN
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
## 2016-04-26 NOTE — ED Notes (Signed)
Pt verbalized understanding of discharge instructions.  Ambulatory and independent at discharge. 

## 2016-05-09 ENCOUNTER — Telehealth: Payer: Self-pay | Admitting: *Deleted

## 2016-05-09 NOTE — Telephone Encounter (Signed)
Patient has not returned calls regarding rescheduling pelvic ultrasound. (canceled appointment scheduled for 01-26-16)  She is scheduled for annual on  Wednesday 06-01-16. Any further follow-up prior to annual exam?

## 2016-05-09 NOTE — Telephone Encounter (Signed)
Will discuss at her annual exam. Thanks. Will close the encounter.

## 2016-06-01 ENCOUNTER — Ambulatory Visit: Payer: Managed Care, Other (non HMO) | Admitting: Obstetrics and Gynecology

## 2016-09-15 ENCOUNTER — Ambulatory Visit (INDEPENDENT_AMBULATORY_CARE_PROVIDER_SITE_OTHER): Payer: Managed Care, Other (non HMO) | Admitting: Obstetrics and Gynecology

## 2016-09-15 ENCOUNTER — Encounter: Payer: Self-pay | Admitting: Obstetrics and Gynecology

## 2016-09-15 ENCOUNTER — Ambulatory Visit: Payer: Managed Care, Other (non HMO) | Admitting: Obstetrics and Gynecology

## 2016-09-15 VITALS — BP 110/80 | HR 72 | Resp 14 | Ht 63.5 in | Wt 206.0 lb

## 2016-09-15 DIAGNOSIS — E559 Vitamin D deficiency, unspecified: Secondary | ICD-10-CM | POA: Diagnosis not present

## 2016-09-15 DIAGNOSIS — E663 Overweight: Secondary | ICD-10-CM | POA: Diagnosis not present

## 2016-09-15 DIAGNOSIS — N9412 Deep dyspareunia: Secondary | ICD-10-CM

## 2016-09-15 DIAGNOSIS — Z0000 Encounter for general adult medical examination without abnormal findings: Secondary | ICD-10-CM | POA: Diagnosis not present

## 2016-09-15 DIAGNOSIS — M6289 Other specified disorders of muscle: Secondary | ICD-10-CM | POA: Diagnosis not present

## 2016-09-15 DIAGNOSIS — Z01419 Encounter for gynecological examination (general) (routine) without abnormal findings: Secondary | ICD-10-CM

## 2016-09-15 DIAGNOSIS — Z124 Encounter for screening for malignant neoplasm of cervix: Secondary | ICD-10-CM

## 2016-09-15 DIAGNOSIS — N912 Amenorrhea, unspecified: Secondary | ICD-10-CM

## 2016-09-15 DIAGNOSIS — E282 Polycystic ovarian syndrome: Secondary | ICD-10-CM | POA: Diagnosis not present

## 2016-09-15 DIAGNOSIS — R011 Cardiac murmur, unspecified: Secondary | ICD-10-CM | POA: Diagnosis not present

## 2016-09-15 LAB — CBC
HEMATOCRIT: 45.4 % — AB (ref 35.0–45.0)
HEMOGLOBIN: 14.9 g/dL (ref 11.7–15.5)
MCH: 30.3 pg (ref 27.0–33.0)
MCHC: 32.8 g/dL (ref 32.0–36.0)
MCV: 92.5 fL (ref 80.0–100.0)
MPV: 10.1 fL (ref 7.5–12.5)
PLATELETS: 249 10*3/uL (ref 140–400)
RBC: 4.91 MIL/uL (ref 3.80–5.10)
RDW: 13.3 % (ref 11.0–15.0)
WBC: 5 10*3/uL (ref 3.8–10.8)

## 2016-09-15 LAB — COMPREHENSIVE METABOLIC PANEL
ALT: 15 U/L (ref 6–29)
AST: 14 U/L (ref 10–30)
Albumin: 4.1 g/dL (ref 3.6–5.1)
Alkaline Phosphatase: 56 U/L (ref 33–115)
BUN: 11 mg/dL (ref 7–25)
CHLORIDE: 106 mmol/L (ref 98–110)
CO2: 24 mmol/L (ref 20–31)
CREATININE: 0.82 mg/dL (ref 0.50–1.10)
Calcium: 9.6 mg/dL (ref 8.6–10.2)
GLUCOSE: 84 mg/dL (ref 65–99)
Potassium: 4.2 mmol/L (ref 3.5–5.3)
SODIUM: 138 mmol/L (ref 135–146)
TOTAL PROTEIN: 7.1 g/dL (ref 6.1–8.1)
Total Bilirubin: 0.7 mg/dL (ref 0.2–1.2)

## 2016-09-15 LAB — LIPID PANEL
CHOL/HDL RATIO: 3.7 ratio (ref <5.0)
Cholesterol: 112 mg/dL (ref <200)
HDL: 30 mg/dL — AB (ref >50)
LDL CALC: 66 mg/dL (ref <100)
TRIGLYCERIDES: 82 mg/dL (ref <150)
VLDL: 16 mg/dL (ref <30)

## 2016-09-15 LAB — TSH: TSH: 2.13 m[IU]/L

## 2016-09-15 NOTE — Progress Notes (Addendum)
26 y.o. [id number] MarriedAfrican AmericanF here for annual exam.   Period Duration (Days): 7 days-- (went 4 months with out cycle before starting on 09-14-16)  Period Pattern: (!) Irregular Menstrual Flow: Light Menstrual Control: Thin pad Menstrual Control Change Freq (Hours): changes pad every 4 hours  Dysmenorrhea: (!) Moderate Dysmenorrhea Symptoms: Cramping  She hasn't tolerated OCP's or the mirena in the past. She tried these for cycle control and dysmenorrhea.  She has a h/o PCOS. She has been having very irregular cycles. They had normalized last year when her primary put her on metformin (she went off secondary to diarrhea).  Cycles have been very irregular cycles. Just started a light cycle a few days ago, very minimal, cramps aren't as bad as typical with this cycle. Typically cycles are heavy and very crampy.  States she tried provera in the past without bleeding. Last script was written in 1/17. Not sure when she last took the provea, maybe last March.  No galactorrhea, some weight loss. She has intermittent deep dyspareunia. She can change positions and it improves.   Patient's last menstrual period was 09/12/2016.          Sexually active: Yes.    The current method of family planning is Essure .    Exercising: Yes.    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 She had a colposcopy, didn't come back for a couple of years. No leep. TDaP:  Unsure  Gardasil: completed all 3    reports that she has never smoked. She has never used smokeless tobacco. She reports that she drinks alcohol. She reports that she does not use drugs. She works as an Chief Executive Officeraccount executer. She has 3 kids: 10,8 and 7.   Past Medical History:  Diagnosis Date  . Anxiety   . Cardiac murmur   . Depression   . GERD (gastroesophageal reflux disease)   . Otitis   . PCOS (polycystic  ovarian syndrome)   . Urticaria     Past Surgical History:  Procedure Laterality Date  . ESSURE TUBAL LIGATION      Current Outpatient Prescriptions  Medication Sig Dispense Refill  . fexofenadine (ALLEGRA) 180 MG tablet     . Lorcaserin HCl (BELVIQ) 10 MG TABS Take 10 mg by mouth 2 (two) times daily.    . Multiple Vitamin (MULTIVITAMIN) tablet Take 1 tablet by mouth daily.      Marland Kitchen. topiramate (TOPAMAX) 25 MG tablet Take 25 mg by mouth at bedtime.    . naproxen sodium (ANAPROX) 550 MG tablet TAKE 1 TABLET BY MOUTH EVERY 12 HOURS AS NEEDED FOR PAIN 30 tablet 0   No current facility-administered medications for this visit.     Family History  Problem Relation Age of Onset  . Heart murmur Father   . Blindness Mother [id number]  . Heart murmur Paternal Grandmother   . Heart failure Paternal Aunt     Review of Systems  Constitutional: Negative.   HENT: Negative.   Eyes: Negative.   Respiratory: Negative.   Cardiovascular: Negative.   Gastrointestinal: Negative.   Endocrine: Negative.   Genitourinary: Positive for dyspareunia.  Musculoskeletal: Negative.   Skin: Negative.   Allergic/Immunologic: Negative.   Neurological: Negative.   Psychiatric/Behavioral: Negative.     Exam:   BP 110/80 (BP Location: Right Arm, Patient Position: Sitting, Cuff Size: Large)   Pulse 72  Resp 14   Ht 5' 3.5" (1.613 m)   Wt 206 lb (93.4 kg)   LMP 09/12/2016   BMI 35.92 kg/m   Weight change: @WEIGHTCHANGE @ Height:   Height: 5' 3.5" (161.3 cm)  Ht Readings from Last 3 Encounters:  09/15/16 5' 3.5" (1.613 m)  04/25/16 5\' 3"  (1.6 m)  05/26/15 5\' 3"  (1.6 m)    General appearance: alert, cooperative and appears stated age Head: Normocephalic, without obvious abnormality, atraumatic Neck: no adenopathy, supple, symmetrical, trachea midline and thyroid normal to inspection and palpation Lungs: clear to auscultation bilaterally Cardiovascular: regular rate and rhythm Breasts: normal appearance, no  masses or tenderness Heart: regular rate and rhythm Abdomen: soft, non-tender; bowel sounds normal; no masses,  no organomegaly Extremities: extremities normal, atraumatic, no cyanosis or edema Skin: Skin color, texture, turgor normal. No rashes or lesions Lymph nodes: Cervical, supraclavicular, and axillary nodes normal. No abnormal inguinal nodes palpated Neurologic: Grossly normal   Pelvic: External genitalia:  no lesions              Urethra:  normal appearing urethra with no masses, tenderness or lesions              Bartholins and Skenes: normal                 Vagina: normal appearing vagina with normal color and discharge, no lesions              Cervix: no lesions               Bimanual Exam:  Uterus:  normal size, contour, position, consistency, mobility, non-tender              Adnexa: no mass, fullness, tenderness               Rectovaginal: Confirms               Anus:  normal sphincter tone, no lesions Pelvic floor: tender bilaterally.   Chaperone was present for exam.  A:  Well Woman with normal exam  Oligomenorrhea- amenorrhea, reports of no bleeding after taking provera, many times.   H/O PCOS  Dyspareunia, pelvic floor is tender. She has had pelvic floor tenderness off and on. Currently declines PT   H/O HSIL in 2012, no treatment, normal pap in 2015 and 2016  Vit d def  P:   TSH, Prolactin, BhcG, FSH, estradiol  Screening labs  Pap with reflex hpv  Discussed breast self exam  Discussed calcium and vit D intake  She will let me know if she wants to f/u with PT for her dyspareunia.   Discussed the importance of regular cycles. I'm confused by her lack of bleeding with provera on multiple occasions. Current bleeding is only spotting.   Will further discuss her cycles once her lab work is back. I told her we would likely try yet another round of provera   Addendum: error above. She has a 3/6 Systolic Ejection Murmur. Overdue with f/u with cardiology. She plans  to make an appointment.

## 2016-09-15 NOTE — Patient Instructions (Signed)
EXERCISE AND DIET:  We recommended that you start or continue a regular exercise program for good health. Regular exercise means any activity that makes your heart beat faster and makes you sweat.  We recommend exercising at least 30 minutes per day at least 3 days a week, preferably 4 or 5.  We also recommend a diet low in fat and sugar.  Inactivity, poor dietary choices and obesity can cause diabetes, heart attack, stroke, and kidney damage, among others.    ALCOHOL AND SMOKING:  Women should limit their alcohol intake to no more than 7 drinks/beers/glasses of wine (combined, not each!) per week. Moderation of alcohol intake to this level decreases your risk of breast cancer and liver damage. And of course, no recreational drugs are part of a healthy lifestyle.  And absolutely no smoking or even second hand smoke. Most people know smoking can cause heart and lung diseases, but did you know it also contributes to weakening of your bones? Aging of your skin?  Yellowing of your teeth and nails?  CALCIUM AND VITAMIN D:  Adequate intake of calcium and Vitamin D are recommended.  The recommendations for exact amounts of these supplements seem to change often, but generally speaking 600 mg of calcium (either carbonate or citrate) and 800 units of Vitamin D per day seems prudent. Certain women may benefit from higher intake of Vitamin D.  If you are among these women, your doctor will have told you during your visit.    PAP SMEARS:  Pap smears, to check for cervical cancer or precancers,  have traditionally been done yearly, although recent scientific advances have shown that most women can have pap smears less often.  However, every woman still should have a physical exam from her gynecologist every year. It will include a breast check, inspection of the vulva and vagina to check for abnormal growths or skin changes, a visual exam of the cervix, and then an exam to evaluate the size and shape of the uterus and  ovaries.  And after 27 years of age, a rectal exam is indicated to check for rectal cancers. We will also provide age appropriate advice regarding health maintenance, like when you should have certain vaccines, screening for sexually transmitted diseases, bone density testing, colonoscopy, mammograms, etc.    Polycystic Ovarian Syndrome Polycystic ovarian syndrome (PCOS) is a common hormonal disorder among women of reproductive age. In most women with PCOS, many small fluid-filled sacs (cysts) grow on the ovaries, and the cysts are not part of a normal menstrual cycle. PCOS can cause problems with your menstrual periods and make it difficult to get pregnant. It can also cause an increased risk of miscarriage with pregnancy. If it is not treated, PCOS can lead to serious health problems, such as diabetes and heart disease. What are the causes? The cause of PCOS is not known, but it may be the result of a combination of certain factors, such as:  Irregular menstrual cycle.  High levels of certain hormones (androgens).  Problems with the hormone that helps to control blood sugar (insulin resistance).  Certain genes. What increases the risk? This condition is more likely to develop in women who have a family history of PCOS. What are the signs or symptoms? Symptoms of PCOS may include:  Multiple ovarian cysts.  Infrequent periods or no periods.  Periods that are too frequent or too heavy.  Unpredictable periods.  Inability to get pregnant (infertility) because of not ovulating.  Increased growth of  hair on the face, chest, stomach, back, thumbs, thighs, or toes.  Acne or oily skin. Acne may develop during adulthood, and it may not respond to treatment.  Pelvic pain.  Weight gain or obesity.  Patches of thickened and dark brown or black skin on the neck, arms, breasts, or thighs (acanthosis nigricans).  Excess hair growth on the face, chest, abdomen, or upper thighs  (hirsutism). How is this diagnosed? This condition is diagnosed based on:  Your medical history.  A physical exam, including a pelvic exam. Your health care provider may look for areas of increased hair growth on your skin.  Tests, such as:  Ultrasound. This may be used to examine the ovaries and the lining of the uterus (endometrium) for cysts.  Blood tests. These may be used to check levels of sugar (glucose), female hormone (testosterone), and female hormones (estrogen and progesterone) in your blood. How is this treated? There is no cure for PCOS, but treatment can help to manage symptoms and prevent more health problems from developing. Treatment varies depending on:  Your symptoms.  Whether you want to have a baby or whether you need birth control (contraception). Treatment may include nutrition and lifestyle changes along with:  Progesterone hormone to start a menstrual period.  Birth control pills to help you have regular menstrual periods.  Medicines to make you ovulate, if you want to get pregnant.  Medicine to reduce excessive hair growth.  Surgery, in severe cases. This may involve making small holes in one or both of your ovaries. This decreases the amount of testosterone that your body produces. Follow these instructions at home:  Take over-the-counter and prescription medicines only as told by your health care provider.  Follow a healthy meal plan. This can help you reduce the effects of PCOS.  Eat a healthy diet that includes lean proteins, complex carbohydrates, fresh fruits and vegetables, low-fat dairy products, and healthy fats. Make sure to eat enough fiber.  If you are overweight, lose weight as told by your health care provider.  Losing 10% of your body weight may improve symptoms.  Your health care provider can determine how much weight loss is best for you and can help you lose weight safely.  Keep all follow-up visits as told by your health care  provider. This is important. Contact a health care provider if:  Your symptoms do not get better with medicine.  You develop new symptoms. This information is not intended to replace advice given to you by your health care provider. Make sure you discuss any questions you have with your health care provider. Document Released: 10/14/2004 Document Revised: 02/16/2016 Document Reviewed: 12/06/2015 Elsevier Interactive Patient Education  2017 [employer]

## 2016-09-16 LAB — PROLACTIN: Prolactin: 17.1 ng/mL

## 2016-09-16 LAB — HCG, QUANTITATIVE, PREGNANCY: hCG, Beta Chain, Quant, S: <2 m[IU]/mL

## 2016-09-16 LAB — ESTRADIOL: Estradiol: 65 pg/mL

## 2016-09-16 LAB — HEMOGLOBIN A1C
HEMOGLOBIN A1C: 4.8 % (ref <5.7)
Mean Plasma Glucose: 91 mg/dL

## 2016-09-16 LAB — IPS PAP TEST WITH REFLEX TO HPV

## 2016-09-16 LAB — VITAMIN D 25 HYDROXY (VIT D DEFICIENCY, FRACTURES): Vit D, 25-Hydroxy: 43 ng/mL (ref 30–100)

## 2016-09-16 LAB — FOLLICLE STIMULATING HORMONE: FSH: 7.6 m[IU]/mL

## 2016-09-20 ENCOUNTER — Telehealth: Payer: Self-pay | Admitting: *Deleted

## 2016-09-20 MED ORDER — MEDROXYPROGESTERONE ACETATE 5 MG PO TABS: 5.0000 mg | ORAL_TABLET | Freq: Every day | ORAL | 1 refills | Status: DC

## 2016-09-20 MED ORDER — METRONIDAZOLE 500 MG PO TABS: 500.0000 mg | ORAL_TABLET | Freq: Two times a day (BID) | ORAL | 0 refills | Status: DC

## 2016-09-20 NOTE — Telephone Encounter (Signed)
Romualdo BolkJill Evelyn Jertson, MD  Lorri FrederickMartha E Eloisa Chokshi, CMA    Please inform the patient that her pap returned with trich and bv. Treat with flagyl 500 mg BID x 7 days. Ivery Qualerich is an STD, her partner must be treated as well. They should avoid intercourse for 1 week after treatment is completed and then use condoms for STD protection.  She needs to return for f/u testing for trich in 3 weeks please set up that appointment. Please try and add GC/Chlamydia testing onto her pap if possible. I would recommend she have testing for other STD's when she comes in for f/u testing on trich.  Her lipids are better than last year. Her good cholesterol is still low. This increases her risk of heart disease in the future. She should eat a healthy diet low in saturated fats and exercise regularly.  The rest of her lab work was normal, including her hormone levels (and negative pregnancy test). Please see if her cycle picked up after her visit here. If not I would recommend she take provera 10 mg x 10 days now. If it did pick up, then please call in provera 5 mg x 5 days every other month if no spontaneous menses (#15, 1 refill). If she doesn't bleed within 2 weeks of finishing provera she should let us know. She has had a tubal occlusion.  02 recall  .         Spoke with patient and went over results in detail. Advised that patient partner needed to be treated for trich as well. Gave all recommendations from Dr. Oscar LaJertson regarding results. Informed patient to avoid alcohol while taking the Flagyl. Patient scheduled for 3 week follow up.  Patient states that her menstrual bleeding is very heavy now. Provera 5mg  called into [facility]. Patient voiced understanding

## 2016-10-12 ENCOUNTER — Telehealth: Payer: Self-pay | Admitting: Obstetrics and Gynecology

## 2016-10-12 ENCOUNTER — Ambulatory Visit: Payer: Self-pay | Admitting: Obstetrics and Gynecology

## 2016-10-12 NOTE — Telephone Encounter (Signed)
Patient called after-hours and left a message cancelling her appointment for "toc [facility]" today due to being "in the hospital with the flu." I returned her call but her telephone number on file was "not in service." I also tried her at the number that called and left the message but the answering message identified it as someone else's number so I did not leave a message to call back.  Routing to provider for FYI.

## 2016-10-12 NOTE — Telephone Encounter (Signed)
Please hold onto the patient's chart. If she doesn't set up a f/u appointment in the next 2 weeks, please send her a letter.

## 2016-10-17 NOTE — Telephone Encounter (Signed)
Left message to call and schedule F/U TOC Trich -eh

## 2016-10-25 NOTE — Telephone Encounter (Signed)
Dr. Oscar La would like to send this patient a letter regarding her [facility] trich appointment. I have left her messages and so has Starla.  Do you have a templet for a letter you would like to use? Thanks Consuella Lose

## 2016-11-09 NOTE — Telephone Encounter (Signed)
Has this been resolved or is further follow up needed? °

## 2016-11-09 NOTE — Telephone Encounter (Signed)
Kennon RoundsSally, See below and please send a letter. Then close the encounter.

## 2016-11-10 ENCOUNTER — Encounter: Payer: Self-pay | Admitting: *Deleted

## 2016-11-10 NOTE — Telephone Encounter (Signed)
Letter to your office for review. 

## 2016-11-11 NOTE — Telephone Encounter (Signed)
Letter reviewed and signed by Dr Oscar LaJertson. Mailed certified [place] mail. Encounter closed.

## 2016-11-29 ENCOUNTER — Encounter: Payer: Self-pay | Admitting: Obstetrics and Gynecology

## 2016-11-29 ENCOUNTER — Ambulatory Visit (INDEPENDENT_AMBULATORY_CARE_PROVIDER_SITE_OTHER): Payer: Managed Care, Other (non HMO) | Admitting: Obstetrics and Gynecology

## 2016-11-29 VITALS — BP 120/78 | HR 80 | Resp 16 | Wt 200.0 lb

## 2016-11-29 DIAGNOSIS — Z8619 Personal history of other infectious and parasitic diseases: Secondary | ICD-10-CM | POA: Diagnosis not present

## 2016-11-29 DIAGNOSIS — Z113 Encounter for screening for infections with a predominantly sexual mode of transmission: Secondary | ICD-10-CM | POA: Diagnosis not present

## 2016-11-29 DIAGNOSIS — N946 Dysmenorrhea, unspecified: Secondary | ICD-10-CM | POA: Diagnosis not present

## 2016-11-29 LAB — HEPATITIS C ANTIBODY: HCV AB: NEGATIVE

## 2016-11-29 MED ORDER — NAPROXEN SODIUM 550 MG PO TABS: 550.0000 mg | ORAL_TABLET | Freq: Two times a day (BID) | ORAL | 1 refills | Status: DC | PRN

## 2016-11-29 NOTE — Progress Notes (Signed)
GYNECOLOGY  VISIT   HPI: 26 y.o.   Married  [place]   [phone] with Patient's last menstrual period was 11/22/2016.   here for TOC Trichomonas. The patients pap from her annual returned with trich and BV. She is married, husband denies affair. They were both treated. She denies current symptoms of vaginitis. She needs more anaprox for dysmenorrhea. Her cramps have been bad.   GYNECOLOGIC HISTORY: Patient's last menstrual period was 11/22/2016. Contraception:Essure  Menopausal hormone therapy: none         OB History    Gravida Para Term Preterm AB Living   3 3 0 3   3   SAB TAB Ectopic Multiple Live Births           3         Patient Active Problem List   Diagnosis Date Noted  . Other and unspecified ovarian cyst 07/19/2012  . Heart murmur 03/29/2011  . Chest pain 03/29/2011  . GERD (gastroesophageal reflux disease) 03/29/2011    Past Medical History:  Diagnosis Date  . Anxiety   . Cardiac murmur   . Depression   . GERD (gastroesophageal reflux disease)   . Otitis   . PCOS (polycystic ovarian syndrome)   . Urticaria     Past Surgical History:  Procedure Laterality Date  . ESSURE TUBAL LIGATION      Current Outpatient Prescriptions  Medication Sig Dispense Refill  . fexofenadine (ALLEGRA) 180 MG tablet     . Lorcaserin HCl (BELVIQ) 10 MG TABS Take 10 mg by mouth 2 (two) times daily.    . medroxyPROGESTERone (PROVERA) 5 MG tablet Take 1 tablet (5 mg total) by mouth daily. Take one tablet PO for 5 days every other month if no spontaneous menses 15 tablet 1  . Multiple Vitamin (MULTIVITAMIN) tablet Take 1 tablet by mouth daily.      . naproxen sodium (ANAPROX) 550 MG tablet TAKE 1 TABLET BY MOUTH EVERY 12 HOURS AS NEEDED FOR PAIN 30 tablet 0  . topiramate (TOPAMAX) 25 MG tablet Take 25 mg by mouth at bedtime.     No current facility-administered medications for this visit.      ALLERGIES: Patient has no known allergies.  Family History  Problem  Relation Age of Onset  . Heart murmur Father   . Blindness Mother [phone]  . Heart murmur Paternal Grandmother   . Heart failure Paternal Aunt     Social History   Social History  . Marital status: Married    Spouse name: N/A  . Number of children: N/A  . Years of education: N/A   Occupational History  . [employer]     Accounts receivable   Social History Main Topics  . Smoking status: Never Smoker  . Smokeless tobacco: Never Used  . Alcohol use 0.0 oz/week     Comment: twice a month   . Drug use: No  . Sexual activity: Yes    Partners: Male    Birth control/ protection: Surgical   Other Topics Concern  . Not on file   Social History Narrative  . No narrative on file    Review of Systems  Constitutional: Negative.   HENT: Negative.   Eyes: Negative.   Respiratory: Negative.   Cardiovascular: Negative.   Gastrointestinal: Negative.   Genitourinary: Negative.   Musculoskeletal: Negative.   Skin: Negative.   Neurological: Negative.   Endo/Heme/Allergies: Negative.   Psychiatric/Behavioral: Negative.     PHYSICAL EXAMINATION:  BP 120/78 (BP Location: Right Arm, Patient Position: Sitting, Cuff Size: Normal)   Pulse 80   Resp 16   Wt 200 lb (90.7 kg)   LMP 11/22/2016   BMI 34.87 kg/m     General appearance: alert, cooperative and appears stated age   Pelvic: External genitalia:  no lesions              Urethra:  normal appearing urethra with no masses, tenderness or lesions              Bartholins and Skenes: normal                 Vagina: normal appearing vagina with normal color and a slight increase in watery white vaginal d/c              Cervix: no lesions  Chaperone was present for exam.  ASSESSMENT H/O trich, s/p treatment, here for TOC STD testing Dysmenorrhea    PLAN STD testing today Discussed the 1% risk of false + trich on the pap Anaprox for cramps   An After Visit Summary was printed and given to the patient.

## 2016-11-29 NOTE — Patient Instructions (Signed)

## 2016-11-30 LAB — GC/CHLAMYDIA PROBE AMP
CT Probe RNA: NOT DETECTED
GC Probe RNA: NOT DETECTED

## 2016-11-30 LAB — STD PANEL
HEP B S AG: NEGATIVE
HIV: NONREACTIVE

## 2016-11-30 LAB — SURESWAB, T.VAGINALIS RNA,QL,FEMALE: Trichomonas vaginalis RNA: NOT DETECTED

## 2016-11-30 LAB — HSV(HERPES SIMPLEX VRS) I + II AB-IGG
HSV 1 GLYCOPROTEIN G AB, IGG: 1.07 {index} — AB (ref <0.90)
HSV 2 GLYCOPROTEIN G AB, IGG: 10.2 {index} — AB (ref <0.90)

## 2016-12-21 ENCOUNTER — Ambulatory Visit (INDEPENDENT_AMBULATORY_CARE_PROVIDER_SITE_OTHER): Payer: Managed Care, Other (non HMO) | Admitting: Obstetrics and Gynecology

## 2016-12-21 ENCOUNTER — Encounter: Payer: Self-pay | Admitting: Obstetrics and Gynecology

## 2016-12-21 VITALS — BP 122/78 | HR 84 | Resp 14 | Wt 209.0 lb

## 2016-12-21 DIAGNOSIS — A6000 Herpesviral infection of urogenital system, unspecified: Secondary | ICD-10-CM | POA: Diagnosis not present

## 2016-12-21 NOTE — Progress Notes (Signed)
GYNECOLOGY  VISIT   HPI: 26 y.o.   Married  [place]   [phone] with Patient's last menstrual period was 11/22/2016.   here for to discuss recent lab results. 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 She reports a questionable HSV 3 years ago. The culture returned as negative. Since then she c/o getting a cluster of tender bumps in between her upper buttocks every few months.    Her husband states he gets some tingling in his mouth, but denies any sores on his mouth or genitals.   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  GYNECOLOGIC HISTORY: Patient's last menstrual period was 11/22/2016. Contraception:Essure Menopausal hormone therapy: none         OB History    Gravida Para Term Preterm AB Living   3 3 0 3   3   SAB TAB Ectopic Multiple Live Births           3         Patient Active Problem List   Diagnosis Date Noted  . Other and unspecified ovarian cyst 07/19/2012  . Heart murmur 03/29/2011  . Chest pain 03/29/2011  . GERD (gastroesophageal reflux disease) 03/29/2011    Past Medical History:  Diagnosis Date  . Anxiety   . Cardiac murmur   . Depression   . GERD (gastroesophageal reflux disease)   . Otitis   . PCOS (polycystic ovarian syndrome)   . Urticaria     Past Surgical History:  Procedure Laterality Date  . ESSURE TUBAL LIGATION      Current Outpatient Prescriptions  Medication Sig Dispense Refill  . fexofenadine (ALLEGRA) 180 MG tablet     . Lorcaserin HCl (BELVIQ) 10 MG TABS Take 10 mg by mouth 2 (two) times daily.    . medroxyPROGESTERone (PROVERA) 5 MG tablet Take 1 tablet (5 mg total) by mouth daily. Take one tablet PO for 5 days every other month if no spontaneous menses 15 tablet 1  . Multiple Vitamin (MULTIVITAMIN) tablet Take 1 tablet by mouth daily.      . naproxen sodium (ANAPROX) 550 MG tablet Take 1 tablet (550 mg total) by mouth every 12 (twelve) hours as needed. for pain 30 tablet 1  . topiramate (TOPAMAX) 25 MG  tablet Take 25 mg by mouth at bedtime.     No current facility-administered medications for this visit.      ALLERGIES: Patient has no known allergies.  Family History  Problem Relation Age of Onset  . Heart murmur Father   . Blindness Mother [phone]  . Heart murmur Paternal Grandmother   . Heart failure Paternal Aunt     Social History   Social History  . Marital status: Married    Spouse name: N/A  . Number of children: N/A  . Years of education: N/A   Occupational History  . [employer]     Accounts receivable   Social History Main Topics  . Smoking status: Never Smoker  . Smokeless tobacco: Never Used  . Alcohol use 0.0 oz/week     Comment: twice a month   . Drug use: No  . Sexual activity: Yes    Partners: Male    Birth control/ protection: Surgical   Other Topics Concern  . Not on file   Social History Narrative  . No narrative on file    Review of Systems  Constitutional: Negative.   HENT: Negative.   Eyes: Negative.   Respiratory: Negative.  Cardiovascular: Negative.   Gastrointestinal: Negative.   Genitourinary: Negative.   Musculoskeletal: Negative.   Skin: Negative.   Neurological: Negative.   Endo/Heme/Allergies: Negative.   Psychiatric/Behavioral: Negative.     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 If his serology is negative, she would  like to go on daily suppression.  Information on HSV discussed and given to the patient   An After Visit Summary was printed and given to the patient.  15 minutes face to face time of which over 50% was spent in counseling.

## 2016-12-21 NOTE — Patient Instructions (Signed)
Genital Herpes Genital herpes is a common sexually transmitted infection (STI) that is caused by a virus. The virus spreads from person to person through sexual contact. Infection can cause itching, blisters, and sores around the genitals or rectum. Symptoms may last several days and then go away This is called an outbreak. However, the virus remains in your body, so you may have more outbreaks in the future. The time between outbreaks varies and can be months or years. Genital herpes affects men and women. It is particularly concerning for pregnant women because the virus can be passed to the baby during delivery and can cause serious problems. Genital herpes is also a concern for people who have a weak disease-fighting (immune) system. What are the causes? This condition is caused by the herpes simplex virus (HSV) type 1 or type 2. The virus may spread through:  Sexual contact with an infected person, including vaginal, anal, and oral sex.  Contact with fluid from a herpes sore.  The skin. This means that you can get herpes from an infected partner even if he or she does not have a visible sore or does not know that he or she is infected. What increases the risk? You are more likely to develop this condition if:  You have sex with many partners.  You do not use latex condoms during sex. What are the signs or symptoms? Most people do not have symptoms (asymptomatic) or have mild symptoms that may be mistaken for other skin problems. Symptoms may include:  Small red bumps near the genitals, rectum, or mouth. These bumps turn into blisters and then turn into sores.  Flu-like symptoms, including:  Fever.  Body aches.  Swollen lymph nodes.  Headache.  Painful urination.  Pain and itching in the genital area or rectal area.  Vaginal discharge.  Tingling or shooting pain in the legs and buttocks. Generally, symptoms are more severe and last longer during the first (primary)  outbreak. Flu-like symptoms are also more common during the primary outbreak. How is this diagnosed? Genital herpes may be diagnosed based on:  A physical exam.  Your medical history.  Blood tests.  A test of a fluid sample (culture) from an open sore. How is this treated? There is no cure for this condition, but treatment with antiviral medicines that are taken by mouth (orally) can do the following:  Speed up healing and relieve symptoms.  Help to reduce the spread of the virus to sexual partners.  Limit the chance of future outbreaks, or make future outbreaks shorter.  Lessen symptoms of future outbreaks. Your health care provider may also recommend pain relief medicines, such as aspirin or ibuprofen. Follow these instructions at home: Sexual activity   Do not have sexual contact during active outbreaks.  Practice safe sex. Latex condoms and female condoms may help prevent the spread of the herpes virus. General instructions   Keep the affected areas dry and clean.  Take over-the-counter and prescription medicines only as told by your health care provider.  Avoid rubbing or touching blisters and sores. If you do touch blisters or sores:  Wash your hands thoroughly with soap and water.  Do not touch your eyes afterward.  To help relieve pain or itching, you may take the following actions as directed by your health care provider:  Apply a cold, wet cloth (cold compress) to affected areas 4-6 times a day.  Apply a substance that protects your skin and reduces bleeding (astringent).  Apply a   gel that helps relieve pain around sores (lidocaine gel).  Take a warm, shallow bath that cleans the genital area (sitz bath).  Keep all follow-up visits as told by your health care provider. This is important. How is this prevented?  Use condoms. Although anyone can get genital herpes during sexual contact, even with the use of a condom, a condom can provide some  protection.  Avoid having multiple sexual partners.  Talk with your sexual partner about any symptoms either of you may have. Also, talk with your partner about any history of STIs.  Get tested for STIs before you have sex. Ask your partner to do the same.  Do not have sexual contact if you have symptoms of genital herpes. Contact a health care provider if:  Your symptoms are not improving with medicine.  Your symptoms return.  You have new symptoms.  You have a fever.  You have abdominal pain.  You have redness, swelling, or pain in your eye.  You notice new sores on other parts of your body.  You are a woman and experience bleeding between menstrual periods.  You have had herpes and you become pregnant or plan to become pregnant. Summary  Genital herpes is a common sexually transmitted infection (STI) that is caused by the herpes simplex virus (HSV) type 1 or type 2.  These viruses are most often spread through sexual contact with an infected person.  You are more likely to develop this condition if you have sex with many partners or you have unprotected sex.  Most people do not have symptoms (asymptomatic) or have mild symptoms that may be mistaken for other skin problems. Symptoms occur as outbreaks that may happen months or years apart.  There is no cure for this condition, but treatment with oral antiviral medicines can reduce symptoms, reduce the chance of spreading the virus to a partner, prevent future outbreaks, or shorten future outbreaks. This information is not intended to replace advice given to you by your health care provider. Make sure you discuss any questions you have with your health care provider. Document Released: 06/17/2000 Document Revised: 05/20/2016 Document Reviewed: 05/20/2016 Elsevier Interactive Patient Education  2017 Elsevier Inc[employer]  

## 2017-06-06 ENCOUNTER — Encounter: Payer: Self-pay | Admitting: Obstetrics and Gynecology

## 2017-06-06 ENCOUNTER — Ambulatory Visit: Payer: 59 | Admitting: Obstetrics and Gynecology

## 2017-06-06 ENCOUNTER — Other Ambulatory Visit: Payer: Self-pay

## 2017-06-06 VITALS — BP 112/60 | HR 92 | Temp 98.0°F | Resp 16 | Wt 206.0 lb

## 2017-06-06 DIAGNOSIS — N898 Other specified noninflammatory disorders of vagina: Secondary | ICD-10-CM

## 2017-06-06 DIAGNOSIS — A6000 Herpesviral infection of urogenital system, unspecified: Secondary | ICD-10-CM | POA: Diagnosis not present

## 2017-06-06 DIAGNOSIS — R109 Unspecified abdominal pain: Secondary | ICD-10-CM

## 2017-06-06 DIAGNOSIS — Z113 Encounter for screening for infections with a predominantly sexual mode of transmission: Secondary | ICD-10-CM

## 2017-06-06 DIAGNOSIS — R102 Pelvic and perineal pain: Secondary | ICD-10-CM | POA: Diagnosis not present

## 2017-06-06 DIAGNOSIS — N941 Unspecified dyspareunia: Secondary | ICD-10-CM | POA: Diagnosis not present

## 2017-06-06 DIAGNOSIS — R309 Painful micturition, unspecified: Secondary | ICD-10-CM | POA: Diagnosis not present

## 2017-06-06 LAB — POCT URINALYSIS DIPSTICK
BILIRUBIN UA: NEGATIVE
Blood, UA: NEGATIVE
GLUCOSE UA: NEGATIVE
KETONES UA: NEGATIVE
Leukocytes, UA: NEGATIVE
Nitrite, UA: NEGATIVE
Protein, UA: NEGATIVE
Urobilinogen, UA: 0.2 E.U./dL
pH, UA: 6 (ref 5.0–8.0)

## 2017-06-06 MED ORDER — NAPROXEN SODIUM 550 MG PO TABS: 550.0000 mg | ORAL_TABLET | Freq: Two times a day (BID) | ORAL | 1 refills | Status: DC | PRN

## 2017-06-06 MED ORDER — VALACYCLOVIR HCL 500 MG PO TABS: 500.0000 mg | ORAL_TABLET | Freq: Every day | ORAL | 3 refills | Status: AC

## 2017-06-06 NOTE — Progress Notes (Signed)
GYNECOLOGY  VISIT   HPI: 26 y.o.   Married  [place]   [phone] with Patient's last menstrual period was 05/22/2017.   here for  Pelvic floor sensitivity. Pain during intercourse. Patient states that she has been having an outbreak every other week. Discomfort with urination.  She has a 1.5 year h/o intermittent pain in the LLQ abdomen/pelvis. Reminds her of round ligament pain. Getting the pain daily, nothing brings it on. Lasts for 2 hours consistently and then come and go. It is a dull, aching pressure. 7-10/10 in severity. She was taking naproxen about 2 x a week. BM 4 x a week (always), no pain with BM. No nausea or emesis, no fever. She does feel distended.  Some discomfort when she voids in the suprapubic region for the last month, mild. No urinary frequency or urgency.  Husband left, she thinks her husband was having an affair. She has had a long h/o deep dyspareunia, all on the left, positional.  She is getting HSV outbreaks every 2 weeks. She had bad leg cramps with OCP's, didn't like the mirena.  She takes provera if needed, h/o oligomenorrhea. She is having more regular cycles in the last 6 months.  She had the essure sterilization.   GYNECOLOGIC HISTORY: Patient's last menstrual period was 05/22/2017. Contraception:Essure Menopausal hormone therapy: none        OB History    Gravida Para Term Preterm AB Living   3 3 0 3   3   SAB TAB Ectopic Multiple Live Births           3         Patient Active Problem List   Diagnosis Date Noted  . Other and unspecified ovarian cyst 07/19/2012  . Heart murmur 03/29/2011  . Chest pain 03/29/2011  . GERD (gastroesophageal reflux disease) 03/29/2011    Past Medical History:  Diagnosis Date  . Anxiety   . Cardiac murmur   . Depression   . GERD (gastroesophageal reflux disease)   . Otitis   . PCOS (polycystic ovarian syndrome)   . Urticaria     Past Surgical History:  Procedure Laterality Date  . ESSURE TUBAL  LIGATION      Current Outpatient Medications  Medication Sig Dispense Refill  . fexofenadine (ALLEGRA) 180 MG tablet     . medroxyPROGESTERone (PROVERA) 5 MG tablet Take 1 tablet (5 mg total) by mouth daily. Take one tablet PO for 5 days every other month if no spontaneous menses 15 tablet 1  . Multiple Vitamin (MULTIVITAMIN) tablet Take 1 tablet by mouth daily.      . naproxen sodium (ANAPROX) 550 MG tablet Take 1 tablet (550 mg total) by mouth every 12 (twelve) hours as needed. for pain 30 tablet 1  . topiramate (TOPAMAX) 25 MG tablet Take 25 mg by mouth at bedtime.     No current facility-administered medications for this visit.      ALLERGIES: Patient has no known allergies.  Family History  Problem Relation Age of Onset  . Heart murmur Father   . Blindness Mother [phone]  . Heart murmur Paternal Grandmother   . Heart failure Paternal Aunt     Social History   Socioeconomic History  . Marital status: Married    Spouse name: Not on file  . Number of children: Not on file  . Years of education: Not on file  . Highest education level: Not on file  Social Needs  . Financial  resource strain: Not on file  . Food insecurity - worry: Not on file  . Food insecurity - inability: Not on file  . Transportation needs - medical: Not on file  . Transportation needs - non-medical: Not on file  Occupational History  . Occupation: [employer]    Comment: Aeronautical engineerAccounts receivable  Tobacco Use  . Smoking status: Never Smoker  . Smokeless tobacco: Never Used  Substance and Sexual Activity  . Alcohol use: Yes    Alcohol/week: 0.0 oz    Comment: twice a month   . Drug use: No  . Sexual activity: Yes    Partners: Male    Birth control/protection: Surgical  Other Topics Concern  . Not on file  Social History Narrative  . Not on file    Review of Systems  Constitutional: Negative.   HENT: Negative.   Eyes: Negative.   Respiratory: Negative.   Cardiovascular: Negative.   Gastrointestinal:  Negative.   Genitourinary:       Painful periods Menstrual cycle changes Pain with intercourse  Musculoskeletal: Negative.   Skin: Negative.   Neurological: Negative.   Endo/Heme/Allergies: Negative.   Psychiatric/Behavioral: Negative.     PHYSICAL EXAMINATION:    BP 112/60 (BP Location: Right Arm, Patient Position: Sitting, Cuff Size: Large)   Pulse 92   Temp 98 F (36.7 C) (Oral)   Resp 16   Wt 206 lb (93.4 kg)   LMP 05/22/2017   BMI 35.92 kg/m     General appearance: alert, cooperative and appears stated age Abdomen: soft, mildly tender in the LLQ, no rebound, no guarding. Mildly distended, no masses,  no organomegaly  Pelvic: External genitalia:  no lesions              Urethra:  normal appearing urethra with no masses, tenderness or lesions              Bartholins and Skenes: normal                 Vagina: normal appearing vagina with normal color and discharge, no lesions              Cervix: no cervical motion tenderness and no lesions              Bimanual Exam:  Uterus:  anteverted, mobile, normal sized, +/- tender              Adnexa: no masses, tender on the left              Rectovaginal: Yes.  .  Confirms.              Anus:  normal sphincter tone, no lesions  Pelvic floor: tender on the left (no change)  Chaperone was present for exam.  ASSESSMENT Abdominal pelvic pain, chronic, getting worse C/O bloating, abdominal pain with urination (negative dip) Risk for STD's (thinks husband cheated) Recurrent HSV Recently self treated for vaginal d/c H/O Essure tubal occlusion     PLAN [facility] for ua, c&s STD testing Affirm Anaprox DS for pain Start daily valtrex Return for ultrasound She hasn't tolerated OCP's or the mirena IUD in the past Discussed possible laparoscopy   An After Visit Summary was printed and given to the patient.

## 2017-06-06 NOTE — Addendum Note (Signed)
Addended by: Shelda JakesHANNER, MARTHA E on: 06/06/2017 02:17 PM   Modules accepted: Orders

## 2017-06-06 NOTE — Addendum Note (Signed)
Addended by: Shelda JakesHANNER, MARTHA E on: 06/06/2017 04:52 PM   Modules accepted: Orders

## 2017-06-07 LAB — URINALYSIS, MICROSCOPIC ONLY
CASTS: NONE SEEN /LPF
RBC, UA: NONE SEEN /hpf (ref 0–?)

## 2017-06-07 LAB — HEP, RPR, HIV PANEL
HIV SCREEN 4TH GENERATION: NONREACTIVE
Hepatitis B Surface Ag: NEGATIVE
RPR: NONREACTIVE

## 2017-06-07 LAB — HEPATITIS C ANTIBODY: HEP C VIRUS AB: 0.1 {s_co_ratio} (ref 0.0–0.9)

## 2017-06-07 LAB — GC/CHLAMYDIA PROBE AMP
Chlamydia trachomatis, NAA: NEGATIVE
NEISSERIA GONORRHOEAE BY PCR: NEGATIVE

## 2017-06-07 LAB — URINE CULTURE

## 2017-06-08 LAB — VAGINITIS/VAGINOSIS, DNA PROBE
Candida Species: NEGATIVE
Gardnerella vaginalis: POSITIVE — AB
Trichomonas vaginosis: NEGATIVE

## 2017-06-09 ENCOUNTER — Telehealth: Payer: Self-pay | Admitting: *Deleted

## 2017-06-09 MED ORDER — METRONIDAZOLE 500 MG PO TABS: 500.0000 mg | ORAL_TABLET | Freq: Two times a day (BID) | ORAL | 0 refills | Status: AC

## 2017-06-09 NOTE — Telephone Encounter (Signed)
-----   Message from Romualdo BolkJill Evelyn Jertson, MD sent at 06/08/2017  5:57 PM EST ----- Please inform the patient that her vaginitis probe was + for BV and treat with flagyl (either oral or vaginal, her choice), no ETOH while on Flagyl.  Oral: Flagyl 500 mg BID x 7 days, or Vaginal: Metrogel, 1 applicator per vagina q day x 5 days.  GC/CT negative, negative urine culture.

## 2017-06-09 NOTE — Telephone Encounter (Signed)
Spoke with patient and results and recommendations. Sent RX for Flagyl. Patient advised to avoid alcohol in take while taking the Flagyl. Patient voiced understanding -eh

## 2017-06-14 ENCOUNTER — Ambulatory Visit: Payer: Managed Care, Other (non HMO) | Admitting: Obstetrics and Gynecology

## 2017-06-20 ENCOUNTER — Encounter: Payer: Self-pay | Admitting: Obstetrics and Gynecology

## 2017-06-20 ENCOUNTER — Ambulatory Visit: Payer: 59 | Admitting: Obstetrics and Gynecology

## 2017-06-20 ENCOUNTER — Other Ambulatory Visit: Payer: Self-pay

## 2017-06-20 ENCOUNTER — Ambulatory Visit (INDEPENDENT_AMBULATORY_CARE_PROVIDER_SITE_OTHER): Payer: 59

## 2017-06-20 VITALS — BP 140/78 | HR 84 | Resp 16 | Wt 209.0 lb

## 2017-06-20 DIAGNOSIS — R109 Unspecified abdominal pain: Secondary | ICD-10-CM

## 2017-06-20 DIAGNOSIS — R102 Pelvic and perineal pain: Secondary | ICD-10-CM

## 2017-06-20 DIAGNOSIS — R350 Frequency of micturition: Secondary | ICD-10-CM | POA: Diagnosis not present

## 2017-06-20 DIAGNOSIS — R3915 Urgency of urination: Secondary | ICD-10-CM

## 2017-06-20 DIAGNOSIS — N949 Unspecified condition associated with female genital organs and menstrual cycle: Secondary | ICD-10-CM | POA: Diagnosis not present

## 2017-06-20 LAB — POCT URINALYSIS DIPSTICK
Bilirubin, UA: NEGATIVE
Blood, UA: NEGATIVE
Glucose, UA: NEGATIVE
Ketones, UA: NEGATIVE
LEUKOCYTES UA: NEGATIVE
NITRITE UA: NEGATIVE
PH UA: 5 (ref 5.0–8.0)
PROTEIN UA: NEGATIVE
UROBILINOGEN UA: NEGATIVE U/dL — AB

## 2017-06-20 MED ORDER — DOXYCYCLINE HYCLATE 100 MG PO CAPS: 100.0000 mg | ORAL_CAPSULE | Freq: Two times a day (BID) | ORAL | 0 refills | Status: DC

## 2017-06-20 NOTE — Progress Notes (Signed)
GYNECOLOGY  VISIT   HPI: 26 y.o.   Married  [place]   [phone] with Patient's last menstrual period was 05/22/2017.   here for follow up pelvic floor pain. She hasn't had luck with OCP's or the mirena. She had terrible leg cramps with the OCP, she expulsed the IUD more than once.  Recently treated for BV, negative genprobe a few weeks ago. She has been sexually active with her ex again. Has pain with sex, deep inside on the left side.  Currently the pain is coming more frequently. Getting the pain every day. Lasting for 3-4 hours, always on the left. Feels like round ligament pain, can affect how she walks.  She has always had irregular BM, has one 3-4 x a week, changes depending on her diet. She has had pelvic floor tenderness in the past.  She also c/o issues with urinary frequency, urgency and abdominal discomfort with voiding over the last week. Negative urine culture a few weeks ago.    GYNECOLOGIC HISTORY: Patient's last menstrual period was 05/22/2017. Contraception:Essure Menopausal hormone therapy: none         OB History    Gravida Para Term Preterm AB Living   3 3 0 3   3   SAB TAB Ectopic Multiple Live Births           3         Patient Active Problem List   Diagnosis Date Noted  . Other and unspecified ovarian cyst 07/19/2012  . Heart murmur 03/29/2011  . Chest pain 03/29/2011  . GERD (gastroesophageal reflux disease) 03/29/2011    Past Medical History:  Diagnosis Date  . Anxiety   . Cardiac murmur   . Depression   . GERD (gastroesophageal reflux disease)   . Otitis   . PCOS (polycystic ovarian syndrome)   . Urticaria     Past Surgical History:  Procedure Laterality Date  . ESSURE TUBAL LIGATION      Current Outpatient Medications  Medication Sig Dispense Refill  . fexofenadine (ALLEGRA) 180 MG tablet     . medroxyPROGESTERone (PROVERA) 5 MG tablet Take 1 tablet (5 mg total) by mouth daily. Take one tablet PO for 5 days every other  month if no spontaneous menses 15 tablet 1  . Multiple Vitamin (MULTIVITAMIN) tablet Take 1 tablet by mouth daily.      . naproxen sodium (ANAPROX) 550 MG tablet Take 1 tablet (550 mg total) by mouth every 12 (twelve) hours as needed. for pain 30 tablet 1  . valACYclovir (VALTREX) 500 MG tablet Take 1 tablet (500 mg total) by mouth daily. Increase to BID for 3 days as needed 90 tablet 3   No current facility-administered medications for this visit.      ALLERGIES: Patient has no known allergies.  Family History  Problem Relation Age of Onset  . Heart murmur Father   . Blindness Mother [phone]  . Heart murmur Paternal Grandmother   . Heart failure Paternal Aunt     Social History   Socioeconomic History  . Marital status: Married    Spouse name: Not on file  . Number of children: Not on file  . Years of education: Not on file  . Highest education level: Not on file  Social Needs  . Financial resource strain: Not on file  . Food insecurity - worry: Not on file  . Food insecurity - inability: Not on file  . Transportation needs - medical: Not on  file  . Transportation needs - non-medical: Not on file  Occupational History  . Occupation: [employer]    Comment: Aeronautical engineerAccounts receivable  Tobacco Use  . Smoking status: Never Smoker  . Smokeless tobacco: Never Used  Substance and Sexual Activity  . Alcohol use: Yes    Alcohol/week: 0.0 oz    Comment: twice a month   . Drug use: No  . Sexual activity: Yes    Partners: Male    Birth control/protection: Surgical  Other Topics Concern  . Not on file  Social History Narrative  . Not on file    Review of Systems  Constitutional: Negative.   HENT: Negative.   Eyes: Negative.   Respiratory: Negative.   Cardiovascular: Negative.   Gastrointestinal: Negative.   Genitourinary: Positive for urgency.       LLQ pain  Loss of sexual interest Pain with intercourse Painful periods Irregular uterine bleeding  Musculoskeletal: Negative.    Skin: Negative.   Neurological: Positive for headaches.  Endo/Heme/Allergies: Negative.   Psychiatric/Behavioral: Positive for depression.       Anxiety    PHYSICAL EXAMINATION:    BP 140/78 (BP Location: Right Arm, Patient Position: Sitting, Cuff Size: Normal)   Pulse 84   Resp 16   Wt 209 lb (94.8 kg)   LMP 05/22/2017   BMI 36.44 kg/m     General appearance: alert, cooperative and appears stated age Abdomen: soft, tender in the lower LLQ, no rebound, no guarding; non distended, no masses,  no organomegaly  Pelvic: External genitalia:  no lesions              Urethra:  normal appearing urethra with no masses, tenderness or lesions              Bartholins and Skenes: normal                 Cervix: no cervical motion tenderness              Bimanual Exam:  Uterus:  anteverted, mobile, tender, normal sized              Adnexa: no masses, tender on the left              Pelvic floor: not tender today  Bladder: not tender  Chaperone was present for exam.  Ultrasound images reviewed with the patient, normal ultrasound. Essure devices seen in the cornua bilaterally.  ASSESSMENT Pelvic pain H/O essure sterilization, don't think laparoscopic removal would be easy given ultrasound findings Hasn't tolerated OCP's or mirena IUD (expulsed x 3) Uterine tenderness Urinary frequency, urgency and dysuria. Negative dip    PLAN Will treat with doxycycline for possible endometritis  F/U exam in 2 weeks Discussed the option of laparoscopy or TLH if her pain doesn't improve. Briefly discussed these options Send urine for ua, c&s. She has naproxen Can take tylenol    An After Visit Summary was printed and given to the patient.

## 2017-06-23 LAB — URINE CULTURE: ORGANISM ID, BACTERIA: NO GROWTH

## 2017-06-23 LAB — URINALYSIS, MICROSCOPIC ONLY: CASTS: NONE SEEN /LPF

## 2017-07-05 ENCOUNTER — Other Ambulatory Visit: Payer: Self-pay

## 2017-07-05 ENCOUNTER — Encounter: Payer: Self-pay | Admitting: Obstetrics and Gynecology

## 2017-07-05 ENCOUNTER — Ambulatory Visit (INDEPENDENT_AMBULATORY_CARE_PROVIDER_SITE_OTHER): Payer: 59 | Admitting: Obstetrics and Gynecology

## 2017-07-05 ENCOUNTER — Telehealth: Payer: Self-pay | Admitting: Obstetrics and Gynecology

## 2017-07-05 VITALS — BP 112/72 | HR 80 | Resp 16 | Wt 208.0 lb

## 2017-07-05 DIAGNOSIS — R102 Pelvic and perineal pain: Secondary | ICD-10-CM | POA: Diagnosis not present

## 2017-07-05 NOTE — Telephone Encounter (Signed)
Spoke with patient regarding benefit for surgery. Patient understood and agreeable. Patient states she will need to review with her employer to confirm what dates she can take off form work. Patient states she will call back and confirm when she is ready to proceed with scheduling. Patient aware this is professional benefit only. Patient aware once surgery have been scheduled she will be contacted by hospital for separate benefits.    cc: Billie RuddySally Yeakley, RN

## 2017-07-05 NOTE — Progress Notes (Signed)
GYNECOLOGY  VISIT   HPI: 27 y.o.   Married  [place]   [phone] with Patient's last menstrual period was 06/06/2017.   here for  2 week follow-up. She was seen 2 weeks ago for f/u of pelvic pain, dyspareunia. She was noted to have uterine tenderness at her last exam and was treated for a possible endometritis. Her pain has actually gotten worse in the last 2 weeks. Feels more constant in the left lower abdomen/pelvis. Feels like round ligament pain, up to a 8/10 in severity. Her cycle started right after the ultrasound. In the past she has had pelvic floor tenderness, not noted at her last exam. She had a negative urine culture and genprobe last month.  She has had a normal ultrasound, hasn't tolerated OCP's or the mirena IUD for pain and cycle control. She has an essure for sterilization.    GYNECOLOGIC HISTORY: Patient's last menstrual period was 06/06/2017. Contraception:Essure Menopausal hormone therapy: none        OB History    Gravida Para Term Preterm AB Living   3 3 0 3   3   SAB TAB Ectopic Multiple Live Births           3         Patient Active Problem List   Diagnosis Date Noted  . Other and unspecified ovarian cyst 07/19/2012  . Heart murmur 03/29/2011  . Chest pain 03/29/2011  . GERD (gastroesophageal reflux disease) 03/29/2011    Past Medical History:  Diagnosis Date  . Anxiety   . Cardiac murmur   . Depression   . GERD (gastroesophageal reflux disease)   . Otitis   . PCOS (polycystic ovarian syndrome)   . Urticaria     Past Surgical History:  Procedure Laterality Date  . ESSURE TUBAL LIGATION      Current Outpatient Medications  Medication Sig Dispense Refill  . doxycycline (VIBRAMYCIN) 100 MG capsule Take 1 capsule (100 mg total) by mouth 2 (two) times daily. Take BID for 10 days.  Take with food as can cause GI distress. 20 capsule 0  . fexofenadine (ALLEGRA) 180 MG tablet     . medroxyPROGESTERone (PROVERA) 5 MG tablet Take 1 tablet  (5 mg total) by mouth daily. Take one tablet PO for 5 days every other month if no spontaneous menses 15 tablet 1  . Multiple Vitamin (MULTIVITAMIN) tablet Take 1 tablet by mouth daily.      . naproxen sodium (ANAPROX) 550 MG tablet Take 1 tablet (550 mg total) by mouth every 12 (twelve) hours as needed. for pain 30 tablet 1  . valACYclovir (VALTREX) 500 MG tablet Take 1 tablet (500 mg total) by mouth daily. Increase to BID for 3 days as needed 90 tablet 3   No current facility-administered medications for this visit.      ALLERGIES: Patient has no known allergies.  Family History  Problem Relation Age of Onset  . Heart murmur Father   . Blindness Mother [phone]  . Heart murmur Paternal Grandmother   . Heart failure Paternal Aunt     Social History   Socioeconomic History  . Marital status: Married    Spouse name: Not on file  . Number of children: Not on file  . Years of education: Not on file  . Highest education level: Not on file  Social Needs  . Financial resource strain: Not on file  . Food insecurity - worry: Not on file  . Food  insecurity - inability: Not on file  . Transportation needs - medical: Not on file  . Transportation needs - non-medical: Not on file  Occupational History  . Occupation: [employer]    Comment: Aeronautical engineer  Tobacco Use  . Smoking status: Never Smoker  . Smokeless tobacco: Never Used  Substance and Sexual Activity  . Alcohol use: Yes    Alcohol/week: 0.0 oz    Comment: twice a month   . Drug use: No  . Sexual activity: Yes    Partners: Male    Birth control/protection: Surgical  Other Topics Concern  . Not on file  Social History Narrative  . Not on file    Review of Systems  Constitutional: Negative.   HENT: Negative.   Eyes: Negative.   Respiratory: Negative.   Cardiovascular: Negative.   Gastrointestinal: Negative.   Genitourinary:       Excess bleeding Painful periods Menstrual cycle changes Unscheduled bleeding or  spotting Loss of sexual interest Pain with intercourse  Musculoskeletal: Negative.   Skin:       Excess hair growth  Neurological: Negative.   Endo/Heme/Allergies: Negative.   Psychiatric/Behavioral: Positive for depression.       Severe anxiety     PHYSICAL EXAMINATION:    BP 112/72 (BP Location: Right Arm, Patient Position: Sitting, Cuff Size: Large)   Pulse 80   Resp 16   Wt 208 lb (94.3 kg)   LMP 06/06/2017   BMI 36.27 kg/m     General appearance: alert, cooperative and appears stated age Abdomen: soft, mildly tender in the LLQ (low), no rebound, no guarding, less tender with tensing her abdominal muscles; non distended, no masses,  no organomegaly  Pelvic: External genitalia:  no lesions              Urethra:  normal appearing urethra with no masses, tenderness or lesions              Cervix: no cervical motion tenderness              Bimanual Exam:  Uterus:  anteverted, mobile, tender, normal sized              Adnexa: no masses, tender on the left              Pelvic floor: not tender  Bladder: not tender  Chaperone was present for exam.  ASSESSMENT Chronic pelvic pain, negative w/u, hasn't responded to OCP's, mirena expulsed, desires definitive surgery I explained that it is possible she would still have pain after surgery    PLAN Discussed option of diagnostic laparoscopy or TLH  She has essure implants which could be contributing to pain, I think removal of the essures would be difficult (given location on ultrasound) She is interested in TLH/BS/cystoscopy.  Discussed total laparoscopic hysterectomy, bilateral salpingectomy and cystoscopy. Reviewed the risks of the procedure, including infection, bleeding, damage to bowel/badder/vessels/ureters.  Discussed the possible need for laparotomy. Discussed post operative recovery and risk of cuff dehiscence. All of her questions were answered    An After Visit Summary was printed and given to the patient.  ~15  minutes face to face time of which over 50% was spent in counseling.

## 2017-07-06 NOTE — Telephone Encounter (Signed)
Patient [place]icemail for [place] [facility].  She would like to further discuss insurance.

## 2017-07-06 NOTE — Telephone Encounter (Signed)
Returned call to patient and answered additional questions she had about her supplemental [employer].  Patient understood information and states she will call back when she is ready to proceed.

## 2017-07-07 NOTE — Telephone Encounter (Signed)
Patient called and states she will confirm and proceed with scheduling recommended surgery on 07/17/17.  Patient would like to schedule on 07/31/17 or 08/01/17. Forwarding to Building control surveyorurse Supervisor.  Forwarding to Billie RuddySally Yeakley, RN

## 2017-07-10 NOTE — Telephone Encounter (Signed)
Call to patient. Advised patient of surgery date of [facility] at 07[facility]. Surgery Information Sheet reviewed with patient and she verbalized understanding. Advised will receive copy in the mail. Address of file verified. Pre and post operative appointments scheduled for patient and she is agreeable to date and time of all appointments.   Routing to provider for final review. Patient agreeable to disposition. Will close encounter.    Cc Billie RuddySally Yeakley, RN

## 2017-07-14 NOTE — Patient Instructions (Addendum)
Your procedure is scheduled on: Monday, Jan 28  [facility] through [facility] at: 6 am  Pick up the phone at the desk and dial [phone].  Call this number if you have problems the morning of surgery: [phone].  Remember: Do NOT eat or Do NOT drink clear liquids (including water) after midnight Sunday  Take these medicines the morning of surgery with a SIP OF WATER:  Valtrex and allegra if needed.  Stop herbal medications and supplements 1 week prior to surgery.  Do NOT wear jewelry (body piercing), metal hair clips/bobby pins, make-up, or nail polish. Do NOT wear lotions, powders, or perfumes.  You may wear deoderant. Do NOT shave for 48 hours prior to surgery. Do NOT bring valuables to the hospital.  Leave suitcase in car.  After surgery it may be brought to your room.  For patients admitted to the hospital, checkout time is 11:00 AM the day of discharge. Have a responsible adult drive you home and stay with you for 24 hours after your procedure.  Home with husband Christina HillDonald cell [phone] or Aunt Christina Mcdaniel cell [phone].

## 2017-07-15 NOTE — H&P (Addendum)
GYNECOLOGY  VISIT   HPI: 27 y.o.   Married  [place]   [phone] with Patient's last menstrual period was 06/06/2017.   here for  2 week follow-up. She was seen 2 weeks ago for f/u of pelvic pain, dyspareunia. She was noted to have uterine tenderness at her last exam and was treated for a possible endometritis. Her pain has actually gotten worse in the last 2 weeks. Feels more constant in the left lower abdomen/pelvis. Feels like round ligament pain, up to a 8/10 in severity. Her cycle started right after the ultrasound. In the past she has had pelvic floor tenderness, not noted at her last exam. She had a negative urine culture and genprobe last month.  She has had a normal ultrasound, hasn't tolerated OCP's or the mirena IUD for pain and cycle control. She has an essure for sterilization.    GYNECOLOGIC HISTORY: Patient's last menstrual period was 06/06/2017. Contraception:Essure Menopausal hormone therapy: none                OB History    Gravida Para Term Preterm AB Living   3 3 0 3   3   SAB TAB Ectopic Multiple Live Births           3             Patient Active Problem List   Diagnosis Date Noted  . Other and unspecified ovarian cyst 07/19/2012  . Heart murmur 03/29/2011  . Chest pain 03/29/2011  . GERD (gastroesophageal reflux disease) 03/29/2011        Past Medical History:  Diagnosis Date  . Anxiety   . Cardiac murmur   . Depression   . GERD (gastroesophageal reflux disease)   . Otitis   . PCOS (polycystic ovarian syndrome)   . Urticaria          Past Surgical History:  Procedure Laterality Date  . ESSURE TUBAL LIGATION            Current Outpatient Medications  Medication Sig Dispense Refill  . doxycycline (VIBRAMYCIN) 100 MG capsule Take 1 capsule (100 mg total) by mouth 2 (two) times daily. Take BID for 10 days.  Take with food as can cause GI distress. 20 capsule 0  . fexofenadine (ALLEGRA) 180 MG tablet      . medroxyPROGESTERone (PROVERA) 5 MG tablet Take 1 tablet (5 mg total) by mouth daily. Take one tablet PO for 5 days every other month if no spontaneous menses 15 tablet 1  . Multiple Vitamin (MULTIVITAMIN) tablet Take 1 tablet by mouth daily.      . naproxen sodium (ANAPROX) 550 MG tablet Take 1 tablet (550 mg total) by mouth every 12 (twelve) hours as needed. for pain 30 tablet 1  . valACYclovir (VALTREX) 500 MG tablet Take 1 tablet (500 mg total) by mouth daily. Increase to BID for 3 days as needed 90 tablet 3   No current facility-administered medications for this visit.      ALLERGIES: Patient has no known allergies.       Family History  Problem Relation Age of Onset  . Heart murmur Father   . Blindness Mother [phone]  . Heart murmur Paternal Grandmother   . Heart failure Paternal Aunt     Social History        Socioeconomic History  . Marital status: Married    Spouse name: Not on file  . Number of children: Not on file  .  Years of education: Not on file  . Highest education level: Not on file  Social Needs  . Financial resource strain: Not on file  . Food insecurity - worry: Not on file  . Food insecurity - inability: Not on file  . Transportation needs - medical: Not on file  . Transportation needs - non-medical: Not on file  Occupational History  . Occupation: [employer]    Comment: Aeronautical engineer  Tobacco Use  . Smoking status: Never Smoker  . Smokeless tobacco: Never Used  Substance and Sexual Activity  . Alcohol use: Yes    Alcohol/week: 0.0 oz    Comment: twice a month   . Drug use: No  . Sexual activity: Yes    Partners: Male    Birth control/protection: Surgical  Other Topics Concern  . Not on file  Social History Narrative  . Not on file    Review of Systems  Constitutional: Negative.   HENT: Negative.   Eyes: Negative.   Respiratory: Negative.   Cardiovascular: Negative.   Gastrointestinal: Negative.    Genitourinary:       Excess bleeding Painful periods Menstrual cycle changes Unscheduled bleeding or spotting Loss of sexual interest Pain with intercourse  Musculoskeletal: Negative.   Skin:       Excess hair growth  Neurological: Negative.   Endo/Heme/Allergies: Negative.   Psychiatric/Behavioral: Positive for depression.       Severe anxiety     PHYSICAL EXAMINATION:    BP 112/72 (BP Location: Right Arm, Patient Position: Sitting, Cuff Size: Large)   Pulse 80   Resp 16   Wt 208 lb (94.3 kg)   LMP 06/06/2017   BMI 36.27 kg/m     General appearance: alert, cooperative and appears stated age Abdomen: soft, mildly tender in the LLQ (low), no rebound, no guarding, less tender with tensing her abdominal muscles; non distended, no masses,  no organomegaly  Pelvic: External genitalia:  no lesions              Urethra:  normal appearing urethra with no masses, tenderness or lesions              Cervix: no cervical motion tenderness              Bimanual Exam:  Uterus:  anteverted, mobile, tender, normal sized              Adnexa: no masses, tender on the left              Pelvic floor: not tender             Bladder: not tender  Chaperone was present for exam.  ASSESSMENT Chronic pelvic pain, negative w/u, hasn't responded to OCP's, mirena expulsed, desires definitive surgery I explained that it is possible she would still have pain after surgery    PLAN Discussed option of diagnostic laparoscopy or TLH  She has essure implants which could be contributing to pain, I think removal of the essures would be difficult (given location on ultrasound) She is interested in TLH/BS/cystoscopy.  Discussed total laparoscopic hysterectomy, bilateral salpingectomy and cystoscopy. Reviewed the risks of the procedure, including infection, bleeding, damage to bowel/badder/vessels/ureters.  Discussed the possible need for laparotomy. Discussed post operative recovery and risk of cuff  dehiscence. All of her questions were answered    An After Visit Summary was printed and given to the patient.  ~15 minutes face to face time of which over 50% was spent in counseling.  Addendum, the patient was seen on 07/18/17, below was her exam: PHYSICAL EXAMINATION:    BP 130/80 (BP Location: Right Arm, Patient Position: Sitting, Cuff Size: Normal)   Pulse 76   Resp 18   Wt 211 lb (95.7 kg)   LMP 07/17/2017   BMI 36.79 kg/m     General appearance: alert, cooperative and appears stated age Neck: no adenopathy, supple, symmetrical, trachea midline and thyroid normal to inspection and palpation Heart: regular rate and rhythm Lungs: CTAB Abdomen: soft, non-tender; bowel sounds normal; no masses,  no organomegaly Extremities: normal, atraumatic, no cyanosis Skin: normal color, texture and turgor, no rashes or lesions Lymph: normal cervical supraclavicular and inguinal nodes Neurologic: grossly normal  Vaginitis probe sent for BV.

## 2017-07-17 ENCOUNTER — Telehealth: Payer: Self-pay | Admitting: Obstetrics and Gynecology

## 2017-07-17 NOTE — Telephone Encounter (Signed)
[facility]

## 2017-07-18 ENCOUNTER — Other Ambulatory Visit: Payer: Self-pay

## 2017-07-18 ENCOUNTER — Ambulatory Visit (INDEPENDENT_AMBULATORY_CARE_PROVIDER_SITE_OTHER): Payer: 59 | Admitting: Obstetrics and Gynecology

## 2017-07-18 ENCOUNTER — Encounter: Payer: Self-pay | Admitting: Obstetrics and Gynecology

## 2017-07-18 VITALS — BP 130/80 | HR 76 | Resp 18 | Wt 211.0 lb

## 2017-07-18 DIAGNOSIS — Z01818 Encounter for other preprocedural examination: Secondary | ICD-10-CM

## 2017-07-18 DIAGNOSIS — R102 Pelvic and perineal pain: Secondary | ICD-10-CM

## 2017-07-18 DIAGNOSIS — Z8742 Personal history of other diseases of the female genital tract: Secondary | ICD-10-CM

## 2017-07-18 NOTE — Progress Notes (Signed)
GYNECOLOGY  VISIT   HPI: 27 y.o.   Married  [place]   [phone] with Patient's last menstrual period was 07/17/2017.   The patient has a h/o chronic pelvic pain, dyspareunia and uterine tenderness with a normal pelvic ultrasound, negative genprobe. No help with antibiotics for possible endometritis. She hasn't responded to OCP's, she expulsed the mirena IUD. She has essure tubal implants. She desires definitive surgery.     GYNECOLOGIC HISTORY: Patient's last menstrual period was 07/17/2017. Contraception:essure tubal occulsion Menopausal hormone therapy: NA        OB History    Gravida Para Term Preterm AB Living   3 3 0 3   3   SAB TAB Ectopic Multiple Live Births           3         Patient Active Problem List   Diagnosis Date Noted  . Other and unspecified ovarian cyst 07/19/2012  . Heart murmur 03/29/2011  . Chest pain 03/29/2011  . GERD (gastroesophageal reflux disease) 03/29/2011    Past Medical History:  Diagnosis Date  . Anxiety   . Cardiac murmur   . Depression   . GERD (gastroesophageal reflux disease)   . Otitis   . PCOS (polycystic ovarian syndrome)   . Urticaria     Past Surgical History:  Procedure Laterality Date  . ESSURE TUBAL LIGATION      Current Outpatient Medications  Medication Sig Dispense Refill  . Cholecalciferol (VITAMIN D3 PO) Take 1 capsule by mouth daily.    . fexofenadine (ALLEGRA) 180 MG tablet Take 180 mg by mouth daily as needed for allergies.     . Multiple Vitamin (MULTIVITAMIN) tablet Take 1 tablet by mouth daily.      . naproxen sodium (ANAPROX) 550 MG tablet Take 1 tablet (550 mg total) by mouth every 12 (twelve) hours as needed. for pain (Patient taking differently: Take 550 mg by mouth every 12 (twelve) hours as needed for mild pain. ) 30 tablet 1  . valACYclovir (VALTREX) 500 MG tablet Take 1 tablet (500 mg total) by mouth daily. Increase to BID for 3 days as needed (Patient taking differently: Take 500 mg by  mouth daily as needed (for outbreaks). ) 90 tablet 3   No current facility-administered medications for this visit.      ALLERGIES: Patient has no known allergies.  Family History  Problem Relation Age of Onset  . Heart murmur Father   . Blindness Mother [phone]  . Heart murmur Paternal Grandmother   . Heart failure Paternal Aunt     Social History   Socioeconomic History  . Marital status: Married    Spouse name: Not on file  . Number of children: Not on file  . Years of education: Not on file  . Highest education level: Not on file  Social Needs  . Financial resource strain: Not on file  . Food insecurity - worry: Not on file  . Food insecurity - inability: Not on file  . Transportation needs - medical: Not on file  . Transportation needs - non-medical: Not on file  Occupational History  . Occupation: [employer]    Comment: Aeronautical engineerAccounts receivable  Tobacco Use  . Smoking status: Never Smoker  . Smokeless tobacco: Never Used  Substance and Sexual Activity  . Alcohol use: Yes    Alcohol/week: 0.0 oz    Comment: twice a month   . Drug use: No  . Sexual activity: Yes  Partners: Male    Birth control/protection: Surgical  Other Topics Concern  . Not on file  Social History Narrative  . Not on file    Review of Systems  Constitutional: Negative.   HENT: Negative.   Eyes: Negative.   Respiratory: Negative.   Cardiovascular: Negative.   Gastrointestinal: Negative.   Genitourinary: Negative.   Musculoskeletal: Negative.   Skin: Negative.   Neurological: Negative.   Endo/Heme/Allergies: Negative.   Psychiatric/Behavioral: Negative.     PHYSICAL EXAMINATION:    BP 130/80 (BP Location: Right Arm, Patient Position: Sitting, Cuff Size: Normal)   Pulse 76   Resp 18   Wt 211 lb (95.7 kg)   LMP 07/17/2017   BMI 36.79 kg/m     General appearance: alert, cooperative and appears stated age Neck: no adenopathy, supple, symmetrical, trachea midline and thyroid normal to  inspection and palpation Heart: regular rate and rhythm Lungs: CTAB Abdomen: soft, non-tender; bowel sounds normal; no masses,  no organomegaly Extremities: normal, atraumatic, no cyanosis Skin: normal color, texture and turgor, no rashes or lesions Lymph: normal cervical supraclavicular and inguinal nodes Neurologic: grossly normal   ASSESSMENT Chronic pelvic pain, dyspareunia with negative w/u. No help with OCP's, expulsed the mirena IUD, has essure tubal occlusion, desires definitive surgery.     PLAN Discussed total laparoscopic hysterectomy, bilateral salpingectomy and cystoscopy. Reviewed the risks of the procedure, including infection, bleeding, damage to bowel/badder/vessels/ureters.  Discussed the possible need for laparotomy. Discussed post operative recovery and risk of cuff dehiscence. All of her questions were answered The patient is aware that she could still have pain after surgery. Vaginitis panel sent (h/o BV, need to treat any recurrent BV prior to surgery)   An After Visit Summary was printed and given to the patient.

## 2017-07-18 NOTE — Progress Notes (Deleted)
jj

## 2017-07-18 NOTE — Telephone Encounter (Signed)
Spoke with patient at check-in today for pre-operative appointment. Verified and answered benefits questions regarding upcoming surgery. No further questions. Ok to close

## 2017-07-19 ENCOUNTER — Other Ambulatory Visit: Payer: Self-pay

## 2017-07-19 ENCOUNTER — Encounter (HOSPITAL_COMMUNITY)
Admission: RE | Admit: 2017-07-19 | Discharge: 2017-07-19 | Disposition: A | Discharge status: Home / Self Care | Payer: 59 | Source: Ambulatory Visit | Attending: Obstetrics and Gynecology | Admitting: Obstetrics and Gynecology

## 2017-07-19 ENCOUNTER — Encounter (HOSPITAL_COMMUNITY): Payer: Self-pay

## 2017-07-19 ENCOUNTER — Telehealth: Payer: Self-pay | Admitting: *Deleted

## 2017-07-19 DIAGNOSIS — F419 Anxiety disorder, unspecified: Secondary | ICD-10-CM | POA: Diagnosis not present

## 2017-07-19 DIAGNOSIS — R102 Pelvic and perineal pain: Secondary | ICD-10-CM | POA: Diagnosis not present

## 2017-07-19 DIAGNOSIS — Z79899 Other long term (current) drug therapy: Secondary | ICD-10-CM | POA: Insufficient documentation

## 2017-07-19 DIAGNOSIS — Z01812 Encounter for preprocedural laboratory examination: Secondary | ICD-10-CM | POA: Diagnosis not present

## 2017-07-19 DIAGNOSIS — E282 Polycystic ovarian syndrome: Secondary | ICD-10-CM | POA: Insufficient documentation

## 2017-07-19 DIAGNOSIS — Z9851 Tubal ligation status: Secondary | ICD-10-CM | POA: Diagnosis not present

## 2017-07-19 DIAGNOSIS — K219 Gastro-esophageal reflux disease without esophagitis: Secondary | ICD-10-CM | POA: Diagnosis not present

## 2017-07-19 DIAGNOSIS — G8929 Other chronic pain: Secondary | ICD-10-CM | POA: Insufficient documentation

## 2017-07-19 DIAGNOSIS — F329 Major depressive disorder, single episode, unspecified: Secondary | ICD-10-CM | POA: Insufficient documentation

## 2017-07-19 HISTORY — DX: Herpesviral infection, unspecified: B00.9

## 2017-07-19 HISTORY — DX: Other seasonal allergic rhinitis: J30.2

## 2017-07-19 LAB — CBC
HEMATOCRIT: 38.8 % (ref 36.0–46.0)
Hemoglobin: 13 g/dL (ref 12.0–15.0)
MCH: 30.9 pg (ref 26.0–34.0)
MCHC: 33.5 g/dL (ref 30.0–36.0)
MCV: 92.2 fL (ref 78.0–100.0)
Platelets: 212 10*3/uL (ref 150–400)
RBC: 4.21 MIL/uL (ref 3.87–5.11)
RDW: 13.6 % (ref 11.5–15.5)
WBC: 6 10*3/uL (ref 4.0–10.5)

## 2017-07-19 LAB — VAGINITIS/VAGINOSIS, DNA PROBE
Candida Species: NEGATIVE
Gardnerella vaginalis: POSITIVE — AB
Trichomonas vaginosis: NEGATIVE

## 2017-07-19 MED ORDER — METRONIDAZOLE 0.75 % VA GEL: 1.0000 | Freq: Two times a day (BID) | VAGINAL | 0 refills | Status: AC

## 2017-07-19 NOTE — Telephone Encounter (Signed)
Left message to call regarding lab results -eh 

## 2017-07-19 NOTE — Telephone Encounter (Signed)
Spoke with patient and gave results and recommendations. Sent in RX for Metrogel. Patient advised to avoid alcohol while taking. Patient voiced understanding -eh

## 2017-07-19 NOTE — Telephone Encounter (Signed)
Patient returning Elaine's call. °

## 2017-07-19 NOTE — Telephone Encounter (Signed)
-----   Message from Romualdo BolkJill Evelyn Jertson, MD sent at 07/19/2017  1:38 PM EST ----- Please inform the patient that her vaginitis probe was + for BV and treat with flagyl (either oral or vaginal, her choice), no ETOH while on Flagyl.  Oral: Flagyl 500 mg BID x 7 days, or Vaginal: Metrogel, 1 applicator per vagina q day x 5 days. Use condoms if sexually active between now and her surgery

## 2017-07-27 DIAGNOSIS — Z0289 Encounter for other administrative examinations: Secondary | ICD-10-CM

## 2017-07-30 ENCOUNTER — Encounter (HOSPITAL_COMMUNITY): Payer: Self-pay | Admitting: Anesthesiology

## 2017-07-30 NOTE — Anesthesia Preprocedure Evaluation (Addendum)
Anesthesia Evaluation  Patient identified by MRN, date of birth, ID band Patient awake    Reviewed: Allergy & Precautions, NPO status , Patient's Chart, lab work & pertinent test results  Airway Mallampati: I       Dental no notable dental hx. (+) Teeth Intact   Pulmonary    Pulmonary exam normal breath sounds clear to auscultation       Cardiovascular Normal cardiovascular exam Rhythm:Regular Rate:Normal     Neuro/Psych negative neurological ROS     GI/Hepatic Neg liver ROS, GERD  ,  Endo/Other  negative endocrine ROS  Renal/GU negative Renal ROS  negative genitourinary   Musculoskeletal negative musculoskeletal ROS (+)   Abdominal (+) + obese,   Peds  Hematology negative hematology ROS (+)   Anesthesia Other Findings   Reproductive/Obstetrics                            Anesthesia Physical Anesthesia Plan  ASA: II  Anesthesia Plan: General   Post-op Pain Management:    Induction: Intravenous  PONV Risk Score and Plan: 4 or greater and Ondansetron, Dexamethasone, Scopolamine patch - Pre-op and Midazolam  Airway Management Planned: Oral ETT  Additional Equipment:   Intra-op Plan:   Post-operative Plan: Extubation in OR  Informed Consent: I have reviewed the patients History and Physical, chart, labs and discussed the procedure including the risks, benefits and alternatives for the proposed anesthesia with the patient or authorized representative who has indicated his/her understanding and acceptance.   Dental advisory given  Plan Discussed with: CRNA and Surgeon  Anesthesia Plan Comments:        Anesthesia Quick Evaluation

## 2017-07-31 ENCOUNTER — Other Ambulatory Visit: Payer: Self-pay

## 2017-07-31 ENCOUNTER — Observation Stay (HOSPITAL_COMMUNITY)
Admission: RE | Admit: 2017-07-31 | Discharge: 2017-07-31 | Disposition: A | Discharge status: Home / Self Care | Payer: 59 | Source: Ambulatory Visit | Attending: Obstetrics and Gynecology | Admitting: Obstetrics and Gynecology

## 2017-07-31 ENCOUNTER — Encounter (HOSPITAL_COMMUNITY): Payer: Self-pay

## 2017-07-31 ENCOUNTER — Ambulatory Visit (HOSPITAL_COMMUNITY): Payer: 59 | Admitting: Anesthesiology

## 2017-07-31 ENCOUNTER — Encounter (HOSPITAL_COMMUNITY)
Admission: RE | Disposition: A | Discharge status: Home / Self Care | Payer: Self-pay | Source: Ambulatory Visit | Attending: Obstetrics and Gynecology

## 2017-07-31 DIAGNOSIS — G8929 Other chronic pain: Principal | ICD-10-CM | POA: Insufficient documentation

## 2017-07-31 DIAGNOSIS — N941 Unspecified dyspareunia: Secondary | ICD-10-CM | POA: Insufficient documentation

## 2017-07-31 DIAGNOSIS — N809 Endometriosis, unspecified: Secondary | ICD-10-CM | POA: Diagnosis not present

## 2017-07-31 DIAGNOSIS — Z23 Encounter for immunization: Secondary | ICD-10-CM | POA: Insufficient documentation

## 2017-07-31 DIAGNOSIS — Z90710 Acquired absence of both cervix and uterus: Secondary | ICD-10-CM | POA: Diagnosis present

## 2017-07-31 DIAGNOSIS — R102 Pelvic and perineal pain: Secondary | ICD-10-CM | POA: Diagnosis present

## 2017-07-31 DIAGNOSIS — N808 Other endometriosis: Secondary | ICD-10-CM | POA: Insufficient documentation

## 2017-07-31 HISTORY — PX: CYSTOSCOPY: SHX5120

## 2017-07-31 HISTORY — PX: TOTAL LAPAROSCOPIC HYSTERECTOMY WITH SALPINGECTOMY: SHX6742

## 2017-07-31 LAB — CREATININE, SERUM: CREATININE: 0.74 mg/dL (ref 0.44–1.00)

## 2017-07-31 LAB — CBC
HEMATOCRIT: 38.7 % (ref 36.0–46.0)
Hemoglobin: 13.3 g/dL (ref 12.0–15.0)
MCH: 31.3 pg (ref 26.0–34.0)
MCHC: 34.4 g/dL (ref 30.0–36.0)
MCV: 91.1 fL (ref 78.0–100.0)
Platelets: 231 10*3/uL (ref 150–400)
RBC: 4.25 MIL/uL (ref 3.87–5.11)
RDW: 13.8 % (ref 11.5–15.5)
WBC: 8.7 10*3/uL (ref 4.0–10.5)

## 2017-07-31 SURGERY — HYSTERECTOMY, TOTAL, LAPAROSCOPIC, WITH SALPINGECTOMY
Anesthesia: General | Site: Bladder

## 2017-07-31 MED ORDER — LIDOCAINE HCL (CARDIAC) 20 MG/ML IV SOLN
INTRAVENOUS | Status: DC | PRN
  Administered 2017-07-31: 100 mg via INTRAVENOUS

## 2017-07-31 MED ORDER — DEXAMETHASONE SODIUM PHOSPHATE 4 MG/ML IJ SOLN
INTRAMUSCULAR | Status: DC | PRN
  Administered 2017-07-31 (×2): 4 mg via INTRAVENOUS

## 2017-07-31 MED ORDER — SCOPOLAMINE 1 MG/3DAYS TD PT72
1.0000 | MEDICATED_PATCH | Freq: Once | TRANSDERMAL | Status: DC
  Administered 2017-07-31: 1.5 mg via TRANSDERMAL

## 2017-07-31 MED ORDER — FENTANYL CITRATE (PF) 100 MCG/2ML IJ SOLN
INTRAMUSCULAR | Status: AC
  Filled 2017-07-31: qty 2

## 2017-07-31 MED ORDER — DOCUSATE SODIUM 100 MG PO CAPS: 100.0000 mg | ORAL_CAPSULE | Freq: Two times a day (BID) | ORAL | Status: DC

## 2017-07-31 MED ORDER — ESMOLOL HCL 100 MG/10ML IV SOLN
INTRAVENOUS | Status: AC
  Filled 2017-07-31: qty 10

## 2017-07-31 MED ORDER — LORATADINE 10 MG PO TABS
10.0000 mg | ORAL_TABLET | Freq: Every day | ORAL | Status: DC | PRN
  Filled 2017-07-31: qty 1

## 2017-07-31 MED ORDER — ROCURONIUM BROMIDE 100 MG/10ML IV SOLN
INTRAVENOUS | Status: DC | PRN
  Administered 2017-07-31: 50 mg via INTRAVENOUS
  Administered 2017-07-31: 10 mg via INTRAVENOUS

## 2017-07-31 MED ORDER — PROPOFOL 10 MG/ML IV BOLUS
INTRAVENOUS | Status: AC
  Filled 2017-07-31: qty 40

## 2017-07-31 MED ORDER — ONDANSETRON HCL 4 MG/2ML IJ SOLN
INTRAMUSCULAR | Status: AC
Start: 2017-07-31 — End: 2017-07-31
  Filled 2017-07-31: qty 2

## 2017-07-31 MED ORDER — MEPERIDINE HCL 25 MG/ML IJ SOLN: 6.2500 mg | INTRAMUSCULAR | Status: DC | PRN

## 2017-07-31 MED ORDER — SENNOSIDES-DOCUSATE SODIUM 8.6-50 MG PO TABS
1.0000 | ORAL_TABLET | Freq: Every evening | ORAL | Status: DC | PRN
Start: 2017-07-31 — End: 2017-07-31

## 2017-07-31 MED ORDER — KETOROLAC TROMETHAMINE 30 MG/ML IJ SOLN: 30.0000 mg | Freq: Four times a day (QID) | INTRAMUSCULAR | Status: DC

## 2017-07-31 MED ORDER — LACTATED RINGERS IV SOLN
INTRAVENOUS | Status: DC
  Administered 2017-07-31 (×3): via INTRAVENOUS

## 2017-07-31 MED ORDER — ZOLPIDEM TARTRATE 5 MG PO TABS: 5.0000 mg | ORAL_TABLET | Freq: Every evening | ORAL | Status: DC | PRN

## 2017-07-31 MED ORDER — KETOROLAC TROMETHAMINE 30 MG/ML IJ SOLN
30.0000 mg | Freq: Four times a day (QID) | INTRAMUSCULAR | Status: DC
  Administered 2017-07-31: 30 mg via INTRAVENOUS
  Filled 2017-07-31: qty 1

## 2017-07-31 MED ORDER — HYDROMORPHONE HCL 1 MG/ML IJ SOLN
INTRAMUSCULAR | Status: AC
  Administered 2017-07-31: 0.25 mg via INTRAVENOUS
  Filled 2017-07-31: qty 0.5

## 2017-07-31 MED ORDER — SODIUM CHLORIDE 0.9 % IV SOLN
INTRAVENOUS | Status: DC | PRN
  Administered 2017-07-31: 60 mL

## 2017-07-31 MED ORDER — FENTANYL CITRATE (PF) 250 MCG/5ML IJ SOLN
INTRAMUSCULAR | Status: AC
  Filled 2017-07-31: qty 5

## 2017-07-31 MED ORDER — METOCLOPRAMIDE HCL 5 MG/ML IJ SOLN
INTRAMUSCULAR | Status: AC
  Filled 2017-07-31: qty 2

## 2017-07-31 MED ORDER — ESMOLOL HCL 100 MG/10ML IV SOLN
INTRAVENOUS | Status: DC | PRN
  Administered 2017-07-31: 30 mg via INTRAVENOUS

## 2017-07-31 MED ORDER — LACTATED RINGERS IV SOLN
INTRAVENOUS | Status: DC
  Administered 2017-07-31: 125 mL/h via INTRAVENOUS

## 2017-07-31 MED ORDER — DOCUSATE SODIUM 100 MG PO CAPS: 100.0000 mg | ORAL_CAPSULE | Freq: Two times a day (BID) | ORAL | 0 refills | Status: DC

## 2017-07-31 MED ORDER — ROCURONIUM BROMIDE 100 MG/10ML IV SOLN
INTRAVENOUS | Status: AC
  Filled 2017-07-31: qty 1

## 2017-07-31 MED ORDER — HYDROMORPHONE HCL 1 MG/ML IJ SOLN: 0.2000 mg | INTRAMUSCULAR | Status: DC | PRN

## 2017-07-31 MED ORDER — PROPOFOL 10 MG/ML IV BOLUS
INTRAVENOUS | Status: DC | PRN
  Administered 2017-07-31: 150 mg via INTRAVENOUS

## 2017-07-31 MED ORDER — BUPIVACAINE HCL (PF) 0.25 % IJ SOLN
INTRAMUSCULAR | Status: AC
  Filled 2017-07-31: qty 30

## 2017-07-31 MED ORDER — SOD CITRATE-CITRIC ACID 500-334 MG/5ML PO SOLN
30.0000 mL | ORAL | Status: AC
  Administered 2017-07-31: 30 mL via ORAL

## 2017-07-31 MED ORDER — FENTANYL CITRATE (PF) 100 MCG/2ML IJ SOLN
INTRAMUSCULAR | Status: DC | PRN
  Administered 2017-07-31: 100 ug via INTRAVENOUS
  Administered 2017-07-31 (×3): 50 ug via INTRAVENOUS
  Administered 2017-07-31: 100 ug via INTRAVENOUS

## 2017-07-31 MED ORDER — ONDANSETRON HCL 4 MG/2ML IJ SOLN
INTRAMUSCULAR | Status: DC | PRN
  Administered 2017-07-31: 4 mg via INTRAVENOUS

## 2017-07-31 MED ORDER — CEFOTETAN DISODIUM-DEXTROSE 2-2.08 GM-%(50ML) IV SOLR
INTRAVENOUS | Status: AC
  Filled 2017-07-31: qty 50

## 2017-07-31 MED ORDER — HYDROMORPHONE HCL 1 MG/ML IJ SOLN
INTRAMUSCULAR | Status: AC
  Filled 2017-07-31: qty 1

## 2017-07-31 MED ORDER — CEFOTETAN DISODIUM-DEXTROSE 2-2.08 GM-%(50ML) IV SOLR
2.0000 g | INTRAVENOUS | Status: AC
  Administered 2017-07-31: 2 g via INTRAVENOUS

## 2017-07-31 MED ORDER — OXYCODONE-ACETAMINOPHEN 5-325 MG PO TABS: 2.0000 | ORAL_TABLET | ORAL | Status: DC | PRN

## 2017-07-31 MED ORDER — ENOXAPARIN SODIUM 40 MG/0.4ML ~~LOC~~ SOLN
40.0000 mg | SUBCUTANEOUS | Status: DC
  Filled 2017-07-31: qty 0.4

## 2017-07-31 MED ORDER — SODIUM CHLORIDE 0.9 % IJ SOLN
INTRAMUSCULAR | Status: AC
  Filled 2017-07-31: qty 50

## 2017-07-31 MED ORDER — LIDOCAINE HCL (CARDIAC) 20 MG/ML IV SOLN
INTRAVENOUS | Status: AC
  Filled 2017-07-31: qty 5

## 2017-07-31 MED ORDER — SUGAMMADEX SODIUM 200 MG/2ML IV SOLN
INTRAVENOUS | Status: AC
  Filled 2017-07-31: qty 2

## 2017-07-31 MED ORDER — KETOROLAC TROMETHAMINE 30 MG/ML IJ SOLN
INTRAMUSCULAR | Status: DC | PRN
  Administered 2017-07-31: 30 mg via INTRAVENOUS

## 2017-07-31 MED ORDER — OXYCODONE-ACETAMINOPHEN 5-325 MG PO TABS: 1.0000 | ORAL_TABLET | ORAL | 0 refills | Status: DC | PRN

## 2017-07-31 MED ORDER — STERILE WATER FOR IRRIGATION IR SOLN
Status: DC | PRN
  Administered 2017-07-31: 1000 mL via INTRAVESICAL

## 2017-07-31 MED ORDER — ROPIVACAINE HCL 5 MG/ML IJ SOLN
INTRAMUSCULAR | Status: AC
  Filled 2017-07-31: qty 30

## 2017-07-31 MED ORDER — ACETAMINOPHEN 325 MG PO TABS: 650.0000 mg | ORAL_TABLET | ORAL | Status: DC | PRN

## 2017-07-31 MED ORDER — DEXAMETHASONE SODIUM PHOSPHATE 4 MG/ML IJ SOLN
INTRAMUSCULAR | Status: AC
  Filled 2017-07-31: qty 1

## 2017-07-31 MED ORDER — KCL IN DEXTROSE-NACL 20-5-0.45 MEQ/L-%-% IV SOLN
INTRAVENOUS | Status: DC
  Filled 2017-07-31: qty 1000

## 2017-07-31 MED ORDER — MIDAZOLAM HCL 2 MG/2ML IJ SOLN
INTRAMUSCULAR | Status: DC | PRN
  Administered 2017-07-31 (×2): 1 mg via INTRAVENOUS

## 2017-07-31 MED ORDER — ONDANSETRON HCL 4 MG PO TABS: 4.0000 mg | ORAL_TABLET | Freq: Three times a day (TID) | ORAL | 0 refills | Status: DC | PRN

## 2017-07-31 MED ORDER — BUPIVACAINE HCL (PF) 0.25 % IJ SOLN
INTRAMUSCULAR | Status: DC | PRN
  Administered 2017-07-31: 18 mL

## 2017-07-31 MED ORDER — SODIUM CHLORIDE 0.9 % IR SOLN
Status: DC | PRN
  Administered 2017-07-31: 3000 mL

## 2017-07-31 MED ORDER — ONDANSETRON HCL 4 MG PO TABS
4.0000 mg | ORAL_TABLET | Freq: Four times a day (QID) | ORAL | Status: DC | PRN
Start: 2017-07-31 — End: 2017-07-31

## 2017-07-31 MED ORDER — HYDROMORPHONE HCL 1 MG/ML IJ SOLN
INTRAMUSCULAR | Status: DC | PRN
  Administered 2017-07-31: 1 mg via INTRAVENOUS

## 2017-07-31 MED ORDER — KETOROLAC TROMETHAMINE 30 MG/ML IJ SOLN: 30.0000 mg | Freq: Once | INTRAMUSCULAR | Status: DC | PRN

## 2017-07-31 MED ORDER — SUGAMMADEX SODIUM 200 MG/2ML IV SOLN
INTRAVENOUS | Status: DC | PRN
  Administered 2017-07-31: 200 mg via INTRAVENOUS

## 2017-07-31 MED ORDER — MENTHOL 3 MG MT LOZG: 1.0000 | LOZENGE | OROMUCOSAL | Status: DC | PRN

## 2017-07-31 MED ORDER — METOCLOPRAMIDE HCL 5 MG/ML IJ SOLN
INTRAMUSCULAR | Status: DC | PRN
  Administered 2017-07-31: 10 mg via INTRAVENOUS

## 2017-07-31 MED ORDER — MIDAZOLAM HCL 2 MG/2ML IJ SOLN
INTRAMUSCULAR | Status: AC
  Filled 2017-07-31: qty 2

## 2017-07-31 MED ORDER — OXYCODONE-ACETAMINOPHEN 5-325 MG PO TABS
1.0000 | ORAL_TABLET | ORAL | Status: DC | PRN
  Administered 2017-07-31: 1 via ORAL
  Filled 2017-07-31: qty 1

## 2017-07-31 MED ORDER — DIPHENHYDRAMINE HCL 50 MG/ML IJ SOLN
INTRAMUSCULAR | Status: DC | PRN
  Administered 2017-07-31: 12.5 mg via INTRAVENOUS

## 2017-07-31 MED ORDER — INFLUENZA VAC SPLIT QUAD 0.5 ML IM SUSY
0.5000 mL | PREFILLED_SYRINGE | INTRAMUSCULAR | Status: AC
  Administered 2017-07-31: 0.5 mL via INTRAMUSCULAR
  Filled 2017-07-31: qty 0.5

## 2017-07-31 MED ORDER — SCOPOLAMINE 1 MG/3DAYS TD PT72
MEDICATED_PATCH | TRANSDERMAL | Status: AC
  Administered 2017-07-31: 1.5 mg via TRANSDERMAL
  Filled 2017-07-31: qty 1

## 2017-07-31 MED ORDER — SODIUM CHLORIDE 0.9 % IJ SOLN
INTRAMUSCULAR | Status: DC | PRN
  Administered 2017-07-31: 10 mL

## 2017-07-31 MED ORDER — ALUM & MAG HYDROXIDE-SIMETH 200-200-20 MG/5ML PO SUSP: 30.0000 mL | ORAL | Status: DC | PRN

## 2017-07-31 MED ORDER — IBUPROFEN 800 MG PO TABS: 800.0000 mg | ORAL_TABLET | Freq: Three times a day (TID) | ORAL | 1 refills | Status: DC | PRN

## 2017-07-31 MED ORDER — ONDANSETRON HCL 4 MG/2ML IJ SOLN
4.0000 mg | Freq: Four times a day (QID) | INTRAMUSCULAR | Status: DC | PRN
  Administered 2017-07-31: 4 mg via INTRAVENOUS
  Filled 2017-07-31: qty 2

## 2017-07-31 MED ORDER — PROMETHAZINE HCL 25 MG/ML IJ SOLN: 6.2500 mg | INTRAMUSCULAR | Status: DC | PRN

## 2017-07-31 MED ORDER — HYDROMORPHONE HCL 1 MG/ML IJ SOLN
0.2500 mg | INTRAMUSCULAR | Status: DC | PRN
  Administered 2017-07-31: 0.25 mg via INTRAVENOUS

## 2017-07-31 MED ORDER — SOD CITRATE-CITRIC ACID 500-334 MG/5ML PO SOLN
ORAL | Status: AC
  Administered 2017-07-31: 30 mL via ORAL
  Filled 2017-07-31: qty 15

## 2017-07-31 MED ORDER — DIPHENHYDRAMINE HCL 50 MG/ML IJ SOLN
INTRAMUSCULAR | Status: AC
  Filled 2017-07-31: qty 1

## 2017-07-31 MED ORDER — ENOXAPARIN SODIUM 40 MG/0.4ML ~~LOC~~ SOLN
40.0000 mg | SUBCUTANEOUS | Status: AC
  Administered 2017-07-31: 40 mg via SUBCUTANEOUS
  Filled 2017-07-31: qty 0.4

## 2017-07-31 SURGICAL SUPPLY — 58 items
APPLICATOR ARISTA FLEXITIP XL (MISCELLANEOUS) ×4 IMPLANT
CABLE HIGH FREQUENCY MONO STRZ (ELECTRODE) IMPLANT
CANISTER SUCT 3000ML PPV (MISCELLANEOUS) ×4 IMPLANT
CELL SAVER LIPIGURD (MISCELLANEOUS) IMPLANT
COVER MAYO STAND STRL (DRAPES) ×4 IMPLANT
DECANTER SPIKE VIAL GLASS SM (MISCELLANEOUS) ×12 IMPLANT
DERMABOND ADVANCED (GAUZE/BANDAGES/DRESSINGS) ×4
DERMABOND ADVANCED .7 DNX12 (GAUZE/BANDAGES/DRESSINGS) ×4 IMPLANT
DURAPREP 26ML APPLICATOR (WOUND CARE) ×4 IMPLANT
EXTRT SYSTEM ALEXIS 14CM (MISCELLANEOUS)
EXTRT SYSTEM ALEXIS 17CM (MISCELLANEOUS)
GLOVE BIOGEL PI IND STRL 7.0 (GLOVE) ×8 IMPLANT
GLOVE BIOGEL PI INDICATOR 7.0 (GLOVE) ×8
GLOVE ECLIPSE 6.5 STRL STRAW (GLOVE) ×4 IMPLANT
GOWN STRL REUS W/TWL LRG LVL3 (GOWN DISPOSABLE) ×16 IMPLANT
HARMONIC RUM II 2.5CM SILVER (DISPOSABLE)
HARMONIC RUM II 3.0CM SILVER (DISPOSABLE)
HARMONIC RUM II 3.5CM SILVER (DISPOSABLE) ×4
HARMONIC RUM II 4.0CM SILVER (DISPOSABLE)
HEMOSTAT ARISTA ABSORB 3G PWDR (MISCELLANEOUS) IMPLANT
LIGASURE VESSEL 5MM BLUNT TIP (ELECTROSURGICAL) ×4 IMPLANT
NEEDLE INSUFFLATION 120MM (ENDOMECHANICALS) ×4 IMPLANT
PACK LAPAROSCOPY BASIN (CUSTOM PROCEDURE TRAY) ×4 IMPLANT
PACK TRENDGUARD 450 HYBRID PRO (MISCELLANEOUS) ×2 IMPLANT
PACK TRENDGUARD 600 HYBRD PROC (MISCELLANEOUS) IMPLANT
POUCH LAPAROSCOPIC INSTRUMENT (MISCELLANEOUS) ×4 IMPLANT
PROTECTOR NERVE ULNAR (MISCELLANEOUS) ×8 IMPLANT
SCALPEL HRMNC RUM II 2.5 SILVR (DISPOSABLE) IMPLANT
SCALPEL HRMNC RUM II 3.0 SILVR (DISPOSABLE) IMPLANT
SCALPEL HRMNC RUM II 3.5 SILVR (DISPOSABLE) ×2 IMPLANT
SCALPEL HRMNC RUM II 4.0 SILVR (DISPOSABLE) IMPLANT
SCISSORS LAP 5X35 DISP (ENDOMECHANICALS) IMPLANT
SET CYSTO W/LG BORE CLAMP LF (SET/KITS/TRAYS/PACK) ×4 IMPLANT
SET IRRIG TUBING LAPAROSCOPIC (IRRIGATION / IRRIGATOR) ×4 IMPLANT
SET TRI-LUMEN FLTR TB AIRSEAL (TUBING) ×4 IMPLANT
SHEARS HARMONIC ACE PLUS 36CM (ENDOMECHANICALS) ×4 IMPLANT
SUT VIC AB 0 CT1 27 (SUTURE) ×2
SUT VIC AB 0 CT1 27XBRD ANBCTR (SUTURE) ×2 IMPLANT
SUT VICRYL 0 UR6 27IN ABS (SUTURE) ×4 IMPLANT
SUT VICRYL 4-0 PS2 18IN ABS (SUTURE) ×4 IMPLANT
SUT VLOC 180 0 9IN  GS21 (SUTURE) ×2
SUT VLOC 180 0 9IN GS21 (SUTURE) ×2 IMPLANT
SYR 50ML LL SCALE MARK (SYRINGE) ×8 IMPLANT
SYSTEM CONTND EXTRCTN KII BLLN (MISCELLANEOUS) IMPLANT
TIP RUMI ORANGE 6.7MMX12CM (TIP) IMPLANT
TIP UTERINE 5.1X6CM LAV DISP (MISCELLANEOUS) IMPLANT
TIP UTERINE 6.7X10CM GRN DISP (MISCELLANEOUS) IMPLANT
TIP UTERINE 6.7X6CM WHT DISP (MISCELLANEOUS) IMPLANT
TIP UTERINE 6.7X8CM BLUE DISP (MISCELLANEOUS) ×4 IMPLANT
TOWEL OR 17X24 6PK STRL BLUE (TOWEL DISPOSABLE) ×8 IMPLANT
TRAY FOLEY CATH SILVER 14FR (SET/KITS/TRAYS/PACK) ×4 IMPLANT
TRENDGUARD 450 HYBRID PRO PACK (MISCELLANEOUS) ×4
TRENDGUARD 600 HYBRID PROC PK (MISCELLANEOUS)
TROCAR ADV FIXATION 5X100MM (TROCAR) ×4 IMPLANT
TROCAR PORT AIRSEAL 5X120 (TROCAR) ×4 IMPLANT
TROCAR XCEL NON BLADE 8MM B8LT (ENDOMECHANICALS) ×4 IMPLANT
TROCAR XCEL NON-BLD 5MMX100MML (ENDOMECHANICALS) ×4 IMPLANT
WARMER LAPAROSCOPE (MISCELLANEOUS) ×4 IMPLANT

## 2017-07-31 NOTE — Anesthesia Postprocedure Evaluation (Signed)
Anesthesia Post Note  Patient: Phyllis GingerJeffredia Joslyn  Procedure(s) Performed: TOTAL LAPAROSCOPIC HYSTERECTOMY WITH SALPINGECTOMY (Bilateral Abdomen) CYSTOSCOPY (N/A Bladder)     Patient location during evaluation: PACU Anesthesia Type: General Level of consciousness: awake Pain management: pain level controlled Vital Signs Assessment: post-procedure vital signs reviewed and stable Respiratory status: spontaneous breathing Cardiovascular status: stable Postop Assessment: no apparent nausea or vomiting Anesthetic complications: no    Last Vitals:  Vitals:   07/31/17 1100 07/31/17 1123  BP: 120/72 119/63  Pulse: (!) 110 (!) 103  Resp: 19 18  Temp: 36.8 C 36.7 C  SpO2: 100% 98%    Last Pain:  Vitals:   07/31/17 1130  TempSrc:   PainSc: (P) 0-No pain   Pain Goal: Patients Stated Pain Goal: 4 (07/31/17 1055)               Ragan Reale JR,JOHN Susann GivensFRANKLIN

## 2017-07-31 NOTE — Op Note (Signed)
Preoperative Diagnosis: Chronic Pelvic Pain  Postoperative Diagnosis: Same, endometriosis  Procedure:  Total Laparoscopic Hysterectomy with bilateral salpingectomy, treatment of endometriosis and cystoscopy  Surgeon: Dr Gertie ExonJill Denitra Donaghey  Assistant: Dr Leda QuailSuzanne Miller   Anesthesia: General  EBL: 50 cc   Fluids: 2,000 cc LR  Urine output: 300 cc  Complications: none  Indications for surgery: The patient is a 27 year old P3 female, who presented with chronic pelvic pain. She had a history of essure tubal sterilization. Work up included a normal ultrasound, negative genprobe and urine culture. She hasn't tolerated OCP's or the mirena IUD. She desires definitive surgery.  The patient is aware of the risks and complications involved with the surgery and consent was obtained prior to the procedure.  Findings: Normal sized uterus, normal adnexa bilaterally, area of endometriosis on the right pelvic side wall near the uterus  Procedure: The patient was taken to the operating room with an IV in placed, preoperative antibiotics and lovenox had been administered. She was placed in the dorsal lithotomy position. General anesthesia was administered. She was prepped and draped in the usual sterile fashion for an abdominal, vaginal surgery. A rumi uterine manipulator was placed, using a # 3.5 cup and a 8 cm extender. A foley catheter was placed.    The umbilicus was everted, injected with 0.25% marcaine and incised with a # 11 blade. 2 towel clips were used to elevated the umbilicus and a veress needle was placed into the abdominal cavity. The abdominal cavity was insufflated with CO2, with normal intraabdominal pressures. After adequate pneumo-insufflation the veress needle was removed and the 5 mm laparoscope was placed into the abdominal cavity using the opti-view trocar. The patient was placed in trendelenburg and the abdominal pelvic cavity was inspected. 3 more trocars were placed: 1 in each lower  quadrant approximately 3 cm medial to and superior to the anterior superior iliac spine and one in the midline approximately 5 cm above the pubic symphysis in the midline. These areas were injected with 0.25% marcaine, incised with a #11 blade and all trocars were inserted with direct visualization with the laparoscope. A # 5 airseal trocar was placed in the RLQ, a 5 mm trocar in the LLQ and a #8 airseal in the midline. The abdominal pelvic cavity was again inspected. A mixture of 30 cc of Robivacaine and 30 cc of NS was place in the pelvic cavity. The ureters were identified bilaterally prior to beginning the hysterectomy.  The left tube was elevated from the pelvic sidewall. The mesosalpinx was cauterized and cut with the ligasure device. The tube was separated from the uterus using the ligasure device and removed through the midline trocar. The left round ligament was cauterized and cut with the ligasure device and the anterior and posterior leafs of the broad ligament were taken down with the ligasure device. The harmonic scalpel was then used to take down the bladder flap and skeltonize the vessels. The left uterine vessels were then clamped, cauterized and ligated with the ligasure device. Hemostasis was excellent. The same procedure was repeated on the right.   Using the rumi manipulator the uterus was pushed up in the pelvic cavity and the harmonic scalpel was used to separate the cervix from the vagina using the harmonic energy. The uterus was  removed vaginally at this time. An occluder was placed in the vagina to maintain pneumoperitoneum. The vaginal cuff was then closed with a 0 V-lock suture. Hemostasis was excellent. The abdominal pelvic cavity was  irrigated and suctioned dry. Pressure was released and hemostasis remained excellent.   Attention was turned to the area of endometriosis, this was cauterized with the ligasure device.   The abdominal cavity was desufflated and the trocars were  removed. The skin was closed with subcuticular stiches of 4-0 vicryl and dermabond was placed over the incisions.  The foley catheter was removed and cystoscopy was performed using a 70 degree scope. Both ureters expelled urine, no bladder abnormalities were noted. The bladder was allowed to drain and the cystoscope was removed.   The patient's abdomen and perineum were cleansed and she was taken out of the dorsal lithotomy position. Upon awakening she was extubated and taken to the recovery room in stable condition. The sponge and instrument counts were correct.

## 2017-07-31 NOTE — Progress Notes (Signed)
Day of Surgery Procedure(s) (LRB): TOTAL LAPAROSCOPIC HYSTERECTOMY WITH SALPINGECTOMY (Bilateral) CYSTOSCOPY (N/A)  Subjective: Patient reports she is doing well, ambulating, voiding, tolerating po, pain is well controlled. She wants to go home.    Objective: I have reviewed patient's vital signs, intake and output and labs.  This SmartLink has not been configured with any valid records.   Lab Results  Component Value Date   WBC 8.7 07/31/2017   HGB 13.3 07/31/2017   HCT 38.7 07/31/2017   MCV 91.1 07/31/2017   PLT 231 07/31/2017    Intake/Output Summary (Last 24 hours) at 07/31/2017 1735 Last data filed at 07/31/2017 1612 Gross per 24 hour  Intake 2100 ml  Output 700 ml  Net 1400 ml   Today's Vitals   07/31/17 1439 07/31/17 1516 07/31/17 1612 07/31/17 1648  BP:   133/72   Pulse:   84   Resp:   18   Temp:   98.3 F (36.8 C)   TempSrc:   Oral   SpO2:   100%   Weight:      Height:      PainSc: Asleep 4   Asleep     General: alert, cooperative and no distress Resp: clear to auscultation bilaterally Cardio: S1, S2 normal GI: soft, not tender to gentle palpation, mildly distended, +BS Extremities: extremities normal, atraumatic, no cyanosis or edema  Assessment: s/p Procedure(s): TOTAL LAPAROSCOPIC HYSTERECTOMY WITH SALPINGECTOMY (Bilateral) CYSTOSCOPY (N/A): stable, progressing well and tolerating diet  Plan: Discharge home  LOS: 1 day    Romualdo BolkJill Evelyn Kinsler Soeder 07/31/2017, 5:33 PM

## 2017-07-31 NOTE — Interval H&P Note (Signed)
History and Physical Interval Note:  07/31/2017 7:14 AM  Christina Mcdaniel  has presented today for surgery, with the diagnosis of chronic pelvic pain  The various methods of treatment have been discussed with the patient and family. After consideration of risks, benefits and other options for treatment, the patient has consented to  Procedure(s): TOTAL LAPAROSCOPIC HYSTERECTOMY WITH SALPINGECTOMY (N/A) CYSTOSCOPY (N/A) as a surgical intervention .  The patient's history has been reviewed, patient examined, no change in status, stable for surgery.  I have reviewed the patient's chart and labs.  Questions were answered to the patient's satisfaction.     Romualdo BolkJill Evelyn Creta Dorame

## 2017-07-31 NOTE — Transfer of Care (Signed)
Immediate Anesthesia Transfer of Care Note  Patient: Christina GingerJeffredia Mcdaniel  Procedure(s) Performed: TOTAL LAPAROSCOPIC HYSTERECTOMY WITH SALPINGECTOMY (Bilateral Abdomen) CYSTOSCOPY (N/A Bladder)  Patient Location: PACU  Anesthesia Type:General  Level of Consciousness: awake, alert  and oriented  Airway & Oxygen Therapy: Patient Spontanous Breathing and Patient connected to nasal cannula oxygen  Post-op Assessment: Report given to RN and Post -op Vital signs reviewed and stable  Post vital signs: Reviewed and stable  Last Vitals:  Vitals:   07/31/17 0625  BP: 132/79  Pulse: 95  Resp: 16  Temp: 36.7 C  SpO2: 99%    Last Pain:  Vitals:   07/31/17 0625  TempSrc: Oral      Patients Stated Pain Goal: 4 (07/31/17 [id number])  Complications: No apparent anesthesia complications

## 2017-07-31 NOTE — Anesthesia Procedure Notes (Signed)
Procedure Name: Intubation Date/Time: 07/31/2017 7:35 AM Performed by: Graciela HusbandsFussell, Jovaughn Wojtaszek O, CRNA Pre-anesthesia Checklist: Patient identified Patient Re-evaluated:Patient Re-evaluated prior to induction Oxygen Delivery Method: Circle system utilized Preoxygenation: Pre-oxygenation with 100% oxygen Induction Type: IV induction Ventilation: Oral airway inserted - appropriate to patient size Laryngoscope Size: Hyacinth MeekerMiller and 2 Grade View: Grade I Tube type: Oral Tube size: 7.0 mm Number of attempts: 1 Airway Equipment and Method: stylet Placement Confirmation: ETT inserted through vocal cords under direct vision,  positive ETCO2 and breath sounds checked- equal and bilateral Secured at: 22 cm Tube secured with: Tape Dental Injury: Teeth and Oropharynx as per pre-operative assessment  Comments: Intubation performed by Valentino NoseSRNA Zoeller

## 2017-08-01 ENCOUNTER — Encounter (HOSPITAL_COMMUNITY): Payer: Self-pay | Admitting: Obstetrics and Gynecology

## 2017-08-01 ENCOUNTER — Telehealth: Payer: Self-pay | Admitting: Obstetrics and Gynecology

## 2017-08-01 NOTE — Telephone Encounter (Signed)
Leave of Absence form forwarded to Dr Oscar LaJertson to review and sign.   cc: Dr Oscar LaJertson

## 2017-08-01 NOTE — Discharge Summary (Signed)
Physician Discharge Summary Note  Patient:  Christina Mcdaniel is an 27 y.o., female MRN:  [id number] DOB:  03-12-1991 Patient phone:  [phone] (home)  Patient address:   [address] [place] [address],   Date of Admission:  07/31/2017 Date of Discharge: 07/31/2017  Reason for Admission:  Total Laparoscopic Hysterectomy with bilateral Salpingectomy for Chronic Pelvic Pain  Principal Problem: Chronic pelvic pain Discharge Diagnoses: Patient Active Problem List   Diagnosis Date Noted  . Status post laparoscopic hysterectomy [Z90.710] 07/31/2017  . Other and unspecified ovarian cyst [N83.209] 07/19/2012  . Heart murmur [R01.1] 03/29/2011  . Chest pain [786.5] 03/29/2011  . GERD (gastroesophageal reflux disease) [K21.9] 03/29/2011     Past Medical History:  Past Medical History:  Diagnosis Date  . Anxiety    no meds  . Cardiac murmur    no problems  . Depression    no meds  . GERD (gastroesophageal reflux disease)    no meds, diet controlled  . HSV infection   . Otitis   . PCOS (polycystic ovarian syndrome)   . Seasonal allergies   . SVD (spontaneous vaginal delivery)    x 3  . Urticaria     Past Surgical History:  Procedure Laterality Date  . CYSTOSCOPY N/A 07/31/2017   Procedure: CYSTOSCOPY;  Surgeon: Romualdo Bolk, MD;  Location: [facility];  Service: Gynecology;  Laterality: N/A;  . ESSURE TUBAL LIGATION    . TOOTH EXTRACTION    . TOTAL LAPAROSCOPIC HYSTERECTOMY WITH SALPINGECTOMY Bilateral 07/31/2017   Procedure: TOTAL LAPAROSCOPIC HYSTERECTOMY WITH SALPINGECTOMY;  Surgeon: Romualdo Bolk, MD;  Location: [facility];  Service: Gynecology;  Laterality: Bilateral;   Family History:  Family History  Problem Relation Age of Onset  . Heart murmur Father   . Blindness Mother 43  . Heart murmur Paternal Grandmother   . Heart failure Paternal Aunt     Social History:  Social History   Substance and Sexual Activity  Alcohol Use Yes  . Alcohol/week:  0.0 oz   Comment: twice a month      Social History   Substance and Sexual Activity  Drug Use No    Social History   Socioeconomic History  . Marital status: Married    Spouse name: None  . Number of children: None  . Years of education: None  . Highest education level: None  Social Needs  . Financial resource strain: None  . Food insecurity - worry: None  . Food insecurity - inability: None  . Transportation needs - medical: None  . Transportation needs - non-medical: None  Occupational History  . Occupation: [employer]    Comment: Aeronautical engineer  Tobacco Use  . Smoking status: Never Smoker  . Smokeless tobacco: Never Used  Substance and Sexual Activity  . Alcohol use: Yes    Alcohol/week: 0.0 oz    Comment: twice a month   . Drug use: No  . Sexual activity: Yes    Partners: Male    Birth control/protection: Surgical    Comment: essure tubal   Other Topics Concern  . None  Social History Narrative  . None    Hospital Course:  The patient underwent surgery as planned, was observed for several hours postoperatively and was discharged home.    Metabolic Disorder Labs:  Lab Results  Component Value Date   HGBA1C 4.8 09/15/2016   MPG 91 09/15/2016   MPG 105 05/26/2015   Lab Results  Component Value Date   PROLACTIN  17.1 09/15/2016   PROLACTIN 16.7 05/13/2015   Lab Results  Component Value Date   CHOL 112 09/15/2016   TRIG 82 09/15/2016   HDL 30 (L) 09/15/2016   CHOLHDL 3.7 09/15/2016   VLDL 16 09/15/2016   LDLCALC 66 09/15/2016   LDLCALC 61 05/26/2015    See Post op note for full exam  Discharge Instructions    Call MD for:   Complete by:  As directed    Heavy vaginal bleeding   Call MD for:  difficulty breathing, headache or visual disturbances   Complete by:  As directed    Call MD for:  extreme fatigue   Complete by:  As directed    Call MD for:  hives   Complete by:  As directed    Call MD for:  persistant dizziness or  light-headedness   Complete by:  As directed    Call MD for:  persistant nausea and vomiting   Complete by:  As directed    Call MD for:  redness, tenderness, or signs of infection (pain, swelling, redness, odor or green/yellow discharge around incision site)   Complete by:  As directed    Call MD for:  severe uncontrolled pain   Complete by:  As directed    Call MD for:  temperature >100.4   Complete by:  As directed    Diet general   Complete by:  As directed    Driving Restrictions   Complete by:  As directed    No driving while still needing percocet   Increase activity slowly   Complete by:  As directed    No dressing needed   Complete by:  As directed    Sexual Activity Restrictions   Complete by:  As directed    No intercourse x 8-12 weeks     Allergies as of 07/31/2017   No Known Allergies     Medication List    STOP taking these medications   naproxen sodium 550 MG tablet Commonly known as:  ANAPROX     TAKE these medications     Indication  docusate sodium 100 MG capsule Commonly known as:  COLACE Take 1 capsule (100 mg total) by mouth 2 (two) times daily.    fexofenadine 180 MG tablet Commonly known as:  ALLEGRA Take 180 mg by mouth daily as needed for allergies.    ibuprofen 800 MG tablet Commonly known as:  ADVIL,MOTRIN Take 1 tablet (800 mg total) by mouth every 8 (eight) hours as needed.    multivitamin tablet Take 1 tablet by mouth daily.    ondansetron 4 MG tablet Commonly known as:  ZOFRAN Take 1 tablet (4 mg total) by mouth every 8 (eight) hours as needed for nausea.    oxyCODONE-acetaminophen 5-325 MG tablet Commonly known as:  PERCOCET/ROXICET Take 1-2 tablets by mouth every 4 (four) hours as needed for severe pain ((when tolerating fluids)).    valACYclovir 500 MG tablet Commonly known as:  VALTREX Take 1 tablet (500 mg total) by mouth daily. Increase to BID for 3 days as needed What changed:  additional instructions    VITAMIN D3  PO Take 1 capsule by mouth daily.         Follow-up recommendations: F/U with Dr Oscar LaJertson in one week    Signed: Romualdo BolkJill Evelyn Emaly Boschert, MD 08/01/2017, 7:54 AM

## 2017-08-02 ENCOUNTER — Encounter (HOSPITAL_COMMUNITY): Payer: Self-pay

## 2017-08-02 ENCOUNTER — Telehealth: Payer: Self-pay | Admitting: Obstetrics and Gynecology

## 2017-08-02 ENCOUNTER — Encounter: Payer: Self-pay | Admitting: Obstetrics and Gynecology

## 2017-08-02 ENCOUNTER — Ambulatory Visit (INDEPENDENT_AMBULATORY_CARE_PROVIDER_SITE_OTHER): Payer: 59 | Admitting: Obstetrics and Gynecology

## 2017-08-02 ENCOUNTER — Inpatient Hospital Stay (HOSPITAL_COMMUNITY)
Admission: AD | Admit: 2017-08-02 | Discharge: 2017-08-02 | Disposition: A | Discharge status: Home / Self Care | Payer: 59 | Source: Ambulatory Visit | Attending: Obstetrics and Gynecology | Admitting: Obstetrics and Gynecology

## 2017-08-02 ENCOUNTER — Inpatient Hospital Stay (HOSPITAL_COMMUNITY): Payer: 59

## 2017-08-02 VITALS — BP 149/90 | HR 80 | Temp 98.2°F | Wt 207.6 lb

## 2017-08-02 DIAGNOSIS — R110 Nausea: Secondary | ICD-10-CM

## 2017-08-02 DIAGNOSIS — Z90710 Acquired absence of both cervix and uterus: Secondary | ICD-10-CM | POA: Insufficient documentation

## 2017-08-02 DIAGNOSIS — Z9079 Acquired absence of other genital organ(s): Secondary | ICD-10-CM | POA: Diagnosis not present

## 2017-08-02 DIAGNOSIS — R197 Diarrhea, unspecified: Secondary | ICD-10-CM

## 2017-08-02 DIAGNOSIS — G8918 Other acute postprocedural pain: Secondary | ICD-10-CM | POA: Insufficient documentation

## 2017-08-02 DIAGNOSIS — R109 Unspecified abdominal pain: Secondary | ICD-10-CM | POA: Diagnosis not present

## 2017-08-02 DIAGNOSIS — R112 Nausea with vomiting, unspecified: Secondary | ICD-10-CM | POA: Insufficient documentation

## 2017-08-02 DIAGNOSIS — Z90722 Acquired absence of ovaries, bilateral: Secondary | ICD-10-CM | POA: Insufficient documentation

## 2017-08-02 LAB — GASTROINTESTINAL PANEL BY PCR, STOOL (REPLACES STOOL CULTURE)
Adenovirus F40/41: NOT DETECTED
Astrovirus: NOT DETECTED
CRYPTOSPORIDIUM: NOT DETECTED
Campylobacter species: NOT DETECTED
Cyclospora cayetanensis: NOT DETECTED
ENTEROAGGREGATIVE E COLI (EAEC): NOT DETECTED
Entamoeba histolytica: NOT DETECTED
Enteropathogenic E coli (EPEC): NOT DETECTED
Enterotoxigenic E coli (ETEC): NOT DETECTED
GIARDIA LAMBLIA: NOT DETECTED
Norovirus GI/GII: NOT DETECTED
PLESIMONAS SHIGELLOIDES: NOT DETECTED
ROTAVIRUS A: NOT DETECTED
SALMONELLA SPECIES: NOT DETECTED
SAPOVIRUS (I, II, IV, AND V): NOT DETECTED
SHIGA LIKE TOXIN PRODUCING E COLI (STEC): NOT DETECTED
Shigella/Enteroinvasive E coli (EIEC): NOT DETECTED
Vibrio cholerae: NOT DETECTED
Vibrio species: NOT DETECTED
YERSINIA ENTEROCOLITICA: NOT DETECTED

## 2017-08-02 LAB — COMPREHENSIVE METABOLIC PANEL
ALK PHOS: 44 U/L (ref 38–126)
ALT: 16 U/L (ref 14–54)
ANION GAP: 7 (ref 5–15)
AST: 19 U/L (ref 15–41)
Albumin: 3.7 g/dL (ref 3.5–5.0)
BUN: 7 mg/dL (ref 6–20)
CALCIUM: 8.7 mg/dL — AB (ref 8.9–10.3)
CHLORIDE: 105 mmol/L (ref 101–111)
CO2: 25 mmol/L (ref 22–32)
Creatinine, Ser: 0.78 mg/dL (ref 0.44–1.00)
GFR calc Af Amer: >60 mL/min (ref >60)
GFR calc non Af Amer: >60 mL/min (ref >60)
Glucose, Bld: 91 mg/dL (ref 65–99)
Potassium: 4.5 mmol/L (ref 3.5–5.1)
SODIUM: 137 mmol/L (ref 135–145)
Total Bilirubin: 0.8 mg/dL (ref 0.3–1.2)
Total Protein: 6.9 g/dL (ref 6.5–8.1)

## 2017-08-02 LAB — URINALYSIS, ROUTINE W REFLEX MICROSCOPIC
BILIRUBIN URINE: NEGATIVE
Glucose, UA: NEGATIVE mg/dL
Hgb urine dipstick: NEGATIVE
KETONES UR: NEGATIVE mg/dL
Leukocytes, UA: NEGATIVE
NITRITE: NEGATIVE
PROTEIN: NEGATIVE mg/dL
Specific Gravity, Urine: 1.011 (ref 1.005–1.030)
pH: 7 (ref 5.0–8.0)

## 2017-08-02 LAB — C DIFFICILE QUICK SCREEN W PCR REFLEX
C DIFFICILE (CDIFF) INTERP: NOT DETECTED
C Diff antigen: NEGATIVE
C Diff toxin: NEGATIVE

## 2017-08-02 LAB — CBC WITH DIFFERENTIAL/PLATELET
BASOS PCT: 1 %
Basophils Absolute: 0 10*3/uL (ref 0.0–0.1)
EOS ABS: 0.1 10*3/uL (ref 0.0–0.7)
Eosinophils Relative: 2 %
HCT: 39.6 % (ref 36.0–46.0)
HEMOGLOBIN: 13.1 g/dL (ref 12.0–15.0)
Lymphocytes Relative: 37 %
Lymphs Abs: 2.1 10*3/uL (ref 0.7–4.0)
MCH: 30.8 pg (ref 26.0–34.0)
MCHC: 33.1 g/dL (ref 30.0–36.0)
MCV: 93.2 fL (ref 78.0–100.0)
Monocytes Absolute: 0.4 10*3/uL (ref 0.1–1.0)
Monocytes Relative: 6 %
NEUTROS PCT: 54 %
Neutro Abs: 3.1 10*3/uL (ref 1.7–7.7)
Platelets: 206 10*3/uL (ref 150–400)
RBC: 4.25 MIL/uL (ref 3.87–5.11)
RDW: 14 % (ref 11.5–15.5)
WBC: 5.7 10*3/uL (ref 4.0–10.5)

## 2017-08-02 MED ORDER — SODIUM CHLORIDE 0.9 % IV BOLUS (SEPSIS)
1000.0000 mL | Freq: Once | INTRAVENOUS | Status: AC
  Administered 2017-08-02: 1000 mL via INTRAVENOUS

## 2017-08-02 MED ORDER — ONDANSETRON HCL 4 MG/2ML IJ SOLN
4.0000 mg | Freq: Once | INTRAMUSCULAR | Status: AC
  Administered 2017-08-02: 4 mg via INTRAVENOUS
  Filled 2017-08-02: qty 2

## 2017-08-02 MED ORDER — HYDROMORPHONE HCL 2 MG PO TABS: 2.0000 mg | ORAL_TABLET | ORAL | 0 refills | Status: DC | PRN

## 2017-08-02 MED ORDER — PROMETHAZINE HCL 25 MG PO TABS: 12.5000 mg | ORAL_TABLET | Freq: Four times a day (QID) | ORAL | 2 refills | Status: DC | PRN

## 2017-08-02 MED ORDER — IOPAMIDOL (ISOVUE-300) INJECTION 61%
100.0000 mL | Freq: Once | INTRAVENOUS | Status: AC | PRN
  Administered 2017-08-02: 100 mL via INTRAVENOUS

## 2017-08-02 MED ORDER — PROMETHAZINE HCL 25 MG/ML IJ SOLN
12.5000 mg | Freq: Once | INTRAMUSCULAR | Status: AC
  Administered 2017-08-02: 12.5 mg via INTRAVENOUS
  Filled 2017-08-02: qty 1

## 2017-08-02 MED ORDER — IOPAMIDOL (ISOVUE-300) INJECTION 61%
30.0000 mL | Freq: Once | INTRAVENOUS | Status: AC | PRN
  Administered 2017-08-02: 30 mL via ORAL

## 2017-08-02 MED ORDER — HYDROMORPHONE HCL 1 MG/ML IJ SOLN
1.0000 mg | Freq: Once | INTRAMUSCULAR | Status: AC
  Administered 2017-08-02: 1 mg via INTRAVENOUS
  Filled 2017-08-02: qty 1

## 2017-08-02 NOTE — Progress Notes (Signed)
GYNECOLOGY  VISIT   HPI: 27 y.o.   Married  [place]   [phone] with Patient's last menstrual period was 07/17/2017 (exact date).   here for severe abdominal cramping following laparoscopic hysterectomy/salpingectomy on 07-31-17. The patient is 2 days s/p TLH/BS. Surgery was uncomplicated. She was d/c'd to home the evening of the procedure. She presents c/o worsening pain since she left the hospital. Her entire abdomen hurts, entire stomach hurts, cramping, constant, 9/10 in severity. The nausea medicine helps, but the pain is making the nausea worse. She is eating normally. She c/o diarrhea, 2-3 episodes of watery diarrhea since she got home. Hydrating well. No fevers. No vomiting. Voiding okay.   Patient complaining of nausea and diarrhea also.  Temp:98.2  GYNECOLOGIC HISTORY: Patient's last menstrual period was 07/17/2017 (exact date). Contraception: Essure/Bil.Salpingectomy Menopausal hormone therapy: none        OB History    Gravida Para Term Preterm AB Living   3 3 0 3   3   SAB TAB Ectopic Multiple Live Births           3         Patient Active Problem List   Diagnosis Date Noted  . Status post laparoscopic hysterectomy 07/31/2017  . Other and unspecified ovarian cyst 07/19/2012  . Heart murmur 03/29/2011  . Chest pain 03/29/2011  . GERD (gastroesophageal reflux disease) 03/29/2011    Past Medical History:  Diagnosis Date  . Anxiety    no meds  . Cardiac murmur    no problems  . Depression    no meds  . GERD (gastroesophageal reflux disease)    no meds, diet controlled  . HSV infection   . Otitis   . PCOS (polycystic ovarian syndrome)   . Seasonal allergies   . SVD (spontaneous vaginal delivery)    x 3  . Urticaria     Past Surgical History:  Procedure Laterality Date  . CYSTOSCOPY N/A 07/31/2017   Procedure: CYSTOSCOPY;  Surgeon: Romualdo BolkJertson, Jill Evelyn, MD;  Location: [facility];  Service: Gynecology;  Laterality: N/A;  . ESSURE TUBAL LIGATION     . TOOTH EXTRACTION    . TOTAL LAPAROSCOPIC HYSTERECTOMY WITH SALPINGECTOMY Bilateral 07/31/2017   Procedure: TOTAL LAPAROSCOPIC HYSTERECTOMY WITH SALPINGECTOMY;  Surgeon: Romualdo BolkJertson, Jill Evelyn, MD;  Location: [facility];  Service: Gynecology;  Laterality: Bilateral;    Current Outpatient Medications  Medication Sig Dispense Refill  . Cholecalciferol (VITAMIN D3 PO) Take 1 capsule by mouth daily.    Marland Kitchen. docusate sodium (COLACE) 100 MG capsule Take 1 capsule (100 mg total) by mouth 2 (two) times daily. 10 capsule 0  . fexofenadine (ALLEGRA) 180 MG tablet Take 180 mg by mouth daily as needed for allergies.     Marland Kitchen. ibuprofen (ADVIL,MOTRIN) 800 MG tablet Take 1 tablet (800 mg total) by mouth every 8 (eight) hours as needed. 30 tablet 1  . Multiple Vitamin (MULTIVITAMIN) tablet Take 1 tablet by mouth daily.      . ondansetron (ZOFRAN) 4 MG tablet Take 1 tablet (4 mg total) by mouth every 8 (eight) hours as needed for nausea. 20 tablet 0  . oxyCODONE-acetaminophen (PERCOCET/ROXICET) 5-325 MG tablet Take 1-2 tablets by mouth every 4 (four) hours as needed for severe pain ((when tolerating fluids)). 30 tablet 0  . valACYclovir (VALTREX) 500 MG tablet Take 1 tablet (500 mg total) by mouth daily. Increase to BID for 3 days as needed (Patient taking differently: Take 500 mg by mouth daily. )  90 tablet 3   No current facility-administered medications for this visit.      ALLERGIES: Patient has no known allergies.  Family History  Problem Relation Age of Onset  . Heart murmur Father   . Blindness Mother 29  . Heart murmur Paternal Grandmother   . Heart failure Paternal Aunt     Social History   Socioeconomic History  . Marital status: Married    Spouse name: Not on file  . Number of children: Not on file  . Years of education: Not on file  . Highest education level: Not on file  Social Needs  . Financial resource strain: Not on file  . Food insecurity - worry: Not on file  . Food insecurity -  inability: Not on file  . Transportation needs - medical: Not on file  . Transportation needs - non-medical: Not on file  Occupational History  . Occupation: [employer]    Comment: Aeronautical engineer  Tobacco Use  . Smoking status: Never Smoker  . Smokeless tobacco: Never Used  Substance and Sexual Activity  . Alcohol use: Yes    Alcohol/week: 0.0 oz    Comment: twice a month   . Drug use: No  . Sexual activity: Yes    Partners: Male    Birth control/protection: Surgical    Comment: essure tubal   Other Topics Concern  . Not on file  Social History Narrative  . Not on file    ROS  PHYSICAL EXAMINATION:    BP (!) 149/90 (BP Location: Left Arm, Patient Position: Sitting, Cuff Size: Large)   Pulse 80   Temp 98.2 F (36.8 C) (Oral)   Wt 207 lb 9.6 oz (94.2 kg)   LMP 07/17/2017 (Exact Date)   BMI 36.77 kg/m     General appearance: alert, cooperative and appears stated age Heart: regular rate and rhythm Lungs: CTAB Abdomen: soft, mildly tender BLQ, no rebound, no guarding, bowel sounds normal; no masses,  no organomegaly Incisions: clean, dry and intact without erythema Extremities: normal, atraumatic, no cyanosis Skin: normal color, texture and turgor, no rashes or lesions l   Pelvic: External genitalia:  no lesions              Urethra:  normal appearing urethra with no masses, tenderness or lesions              Bartholins and Skenes: normal                               Bimanual Exam:  Cuff not tender, no masses  Chaperone was present for exam.  ASSESSMENT POD#2 s/p TLH/BS, c/o diffuse abdominal pain, nausea and diarrhea (2-3 episodes of watery diarrhea)    PLAN Send to ER, will gets labs, CT scan, give IV fluids and pain meds. Stool sample    An After Visit Summary was printed and given to the patient.

## 2017-08-02 NOTE — Telephone Encounter (Signed)
Patient had a total laparoscopic hysterectomy with salpingectomy on 07/31/2017. Patient states she is having severe cramping that is a 9/10. Took Percocet 1 hour ago without relief. Has been alternating Percocet and Ibuprofen without relief. Has nausea from pain. No vomiting. Denies fever or chills. Advised will need to be seen for further evaluation. Patient is agreeable. Appointment scheduled for today 08/02/2017 at 9:45 am. Patient's spouse will drive her to the office.  Routing to provider for final review. Patient agreeable to disposition. Will close encounter.

## 2017-08-02 NOTE — MAU Note (Signed)
Pt was in the office for a f/u from GYN surgery on 1/28. Having pain 9/10, nausea and diarrhea. Pt sent from office for pain management and work up for the nausea and diarrhea.

## 2017-08-02 NOTE — Telephone Encounter (Signed)
Patient called and said, "I had surgery 07/31/17 and the pain medication is not touching my pain." An unidentified voice in the background said her pain level is at a 9 on a 1-10 scale.  Last seen for surgery: 07/31/17

## 2017-08-02 NOTE — MAU Provider Note (Signed)
Chief Complaint: Abdominal Pain; Diarrhea; and Nausea   First Provider Initiated Contact with Patient 08/02/17 1203      SUBJECTIVE HPI: Christina Mcdaniel is a 27 y.o. [id number] who is post op day # 2 following TLH/BS who presents to maternity admissions sent from the office with abdominal pain, nausea and diarrhea. The pain started after her surgery but is worsening today.  The pain is in her entire abdomen, is cramping, constant and severe, 9/10 on pain scale.  The pain is associated with nausea but no vomiting and diarrhea x 2-3 in 2 days.  She is eating and drinking without difficulty.  She denies any constipation or problems urinating.  She denies fever/chills.  There are no other associated symptoms. She is taking pain medication and antiemetics which are not helping. She denies vaginal bleeding, vaginal itching/burning, urinary symptoms, h/a, dizziness,  fever/chills.     HPI  Past Medical History:  Diagnosis Date  . Anxiety    no meds  . Cardiac murmur    no problems  . Depression    no meds  . GERD (gastroesophageal reflux disease)    no meds, diet controlled  . HSV infection   . Otitis   . PCOS (polycystic ovarian syndrome)   . Seasonal allergies   . SVD (spontaneous vaginal delivery)    x 3  . Urticaria    Past Surgical History:  Procedure Laterality Date  . CYSTOSCOPY N/A 07/31/2017   Procedure: CYSTOSCOPY;  Surgeon: Romualdo Bolk, MD;  Location: [facility];  Service: Gynecology;  Laterality: N/A;  . ESSURE TUBAL LIGATION    . TOOTH EXTRACTION    . TOTAL LAPAROSCOPIC HYSTERECTOMY WITH SALPINGECTOMY Bilateral 07/31/2017   Procedure: TOTAL LAPAROSCOPIC HYSTERECTOMY WITH SALPINGECTOMY;  Surgeon: Romualdo Bolk, MD;  Location: [facility];  Service: Gynecology;  Laterality: Bilateral;   Social History   Socioeconomic History  . Marital status: Married    Spouse name: Not on file  . Number of children: Not on file  . Years of education: Not on file  . Highest  education level: Not on file  Social Needs  . Financial resource strain: Not on file  . Food insecurity - worry: Not on file  . Food insecurity - inability: Not on file  . Transportation needs - medical: Not on file  . Transportation needs - non-medical: Not on file  Occupational History  . Occupation: [employer]    Comment: Aeronautical engineer  Tobacco Use  . Smoking status: Never Smoker  . Smokeless tobacco: Never Used  Substance and Sexual Activity  . Alcohol use: Yes    Alcohol/week: 0.0 oz    Comment: twice a month   . Drug use: No  . Sexual activity: Yes    Partners: Male    Birth control/protection: Surgical    Comment: essure tubal   Other Topics Concern  . Not on file  Social History Narrative  . Not on file   No current facility-administered medications on file prior to encounter.    Current Outpatient Medications on File Prior to Encounter  Medication Sig Dispense Refill  . Cholecalciferol (VITAMIN D3 PO) Take 1 capsule by mouth daily.    . fexofenadine (ALLEGRA) 180 MG tablet Take 180 mg by mouth daily as needed for allergies.     Marland Kitchen ibuprofen (ADVIL,MOTRIN) 800 MG tablet Take 1 tablet (800 mg total) by mouth every 8 (eight) hours as needed. 30 tablet 1  . Multiple Vitamin (MULTIVITAMIN) tablet Take 1 tablet  by mouth daily.      . ondansetron (ZOFRAN) 4 MG tablet Take 1 tablet (4 mg total) by mouth every 8 (eight) hours as needed for nausea. 20 tablet 0  . oxyCODONE-acetaminophen (PERCOCET/ROXICET) 5-325 MG tablet Take 1-2 tablets by mouth every 4 (four) hours as needed for severe pain ((when tolerating fluids)). 30 tablet 0  . valACYclovir (VALTREX) 500 MG tablet Take 1 tablet (500 mg total) by mouth daily. Increase to BID for 3 days as needed (Patient taking differently: Take 500 mg by mouth daily. ) 90 tablet 3   No Known Allergies  ROS:  Review of Systems  Constitutional: Negative for chills, fatigue and fever.  Respiratory: Negative for shortness of breath.    Cardiovascular: Negative for chest pain.  Gastrointestinal: Positive for abdominal pain, diarrhea and nausea. Negative for vomiting.  Genitourinary: Positive for pelvic pain. Negative for difficulty urinating, dysuria, flank pain, vaginal bleeding, vaginal discharge and vaginal pain.  Musculoskeletal: Positive for back pain.  Neurological: Negative for dizziness and headaches.  Psychiatric/Behavioral: Negative.      I have reviewed patient's Past Medical Hx, Surgical Hx, Family Hx, Social Hx, medications and allergies.   Physical Exam   Patient Vitals for the past 24 hrs:  BP Temp Temp src Pulse Resp Weight  08/02/17 1620 133/80 - - - - -  08/02/17 1158 136/87 - - 83 - -  08/02/17 1110 (!) 158/85 98.1 F (36.7 C) Oral 75 18 -  08/02/17 1053 - - - - - 207 lb (93.9 kg)   Constitutional: Well-developed, well-nourished female in no acute distress.  Cardiovascular: normal rate Respiratory: normal effort GI: Abd soft, non-tender. Pos BS x 4 MS: Extremities nontender, no edema, normal ROM Neurologic: Alert and oriented x 4.  GU: Neg CVAT.  PELVIC EXAM: Deferred  LAB RESULTS Results for orders placed or performed during the hospital encounter of 08/02/17 (from the past 24 hour(s))  Urinalysis, Routine w reflex microscopic     Status: Abnormal   Collection Time: 08/02/17 10:56 AM  Result Value Ref Range   Color, Urine STRAW (A) YELLOW   APPearance CLEAR CLEAR   Specific Gravity, Urine 1.011 1.005 - 1.030   pH 7.0 5.0 - 8.0   Glucose, UA NEGATIVE NEGATIVE mg/dL   Hgb urine dipstick NEGATIVE NEGATIVE   Bilirubin Urine NEGATIVE NEGATIVE   Ketones, ur NEGATIVE NEGATIVE mg/dL   Protein, ur NEGATIVE NEGATIVE mg/dL   Nitrite NEGATIVE NEGATIVE   Leukocytes, UA NEGATIVE NEGATIVE  CBC with Differential/Platelet     Status: None   Collection Time: 08/02/17 11:21 AM  Result Value Ref Range   WBC 5.7 4.0 - 10.5 K/uL   RBC 4.25 3.87 - 5.11 MIL/uL   Hemoglobin 13.1 12.0 - 15.0 g/dL    HCT 16.1 09.6 - 04.5 %   MCV 93.2 78.0 - 100.0 fL   MCH 30.8 26.0 - 34.0 pg   MCHC 33.1 30.0 - 36.0 g/dL   RDW 40.9 81.1 - 91.4 %   Platelets 206 150 - 400 K/uL   Neutrophils Relative % 54 %   Neutro Abs 3.1 1.7 - 7.7 K/uL   Lymphocytes Relative 37 %   Lymphs Abs 2.1 0.7 - 4.0 K/uL   Monocytes Relative 6 %   Monocytes Absolute 0.4 0.1 - 1.0 K/uL   Eosinophils Relative 2 %   Eosinophils Absolute 0.1 0.0 - 0.7 K/uL   Basophils Relative 1 %   Basophils Absolute 0.0 0.0 - 0.1 K/uL  Comprehensive  metabolic panel     Status: Abnormal   Collection Time: 08/02/17 11:21 AM  Result Value Ref Range   Sodium 137 135 - 145 mmol/L   Potassium 4.5 3.5 - 5.1 mmol/L   Chloride 105 101 - 111 mmol/L   CO2 25 22 - 32 mmol/L   Glucose, Bld 91 65 - 99 mg/dL   BUN 7 6 - 20 mg/dL   Creatinine, Ser 1.61 0.44 - 1.00 mg/dL   Calcium 8.7 (L) 8.9 - 10.3 mg/dL   Total Protein 6.9 6.5 - 8.1 g/dL   Albumin 3.7 3.5 - 5.0 g/dL   AST 19 15 - 41 U/L   ALT 16 14 - 54 U/L   Alkaline Phosphatase 44 38 - 126 U/L   Total Bilirubin 0.8 0.3 - 1.2 mg/dL   GFR calc non Af Amer >60 >60 mL/min   GFR calc Af Amer >60 >60 mL/min   Anion gap 7 5 - 15  C difficile quick scan w PCR reflex     Status: None   Collection Time: 08/02/17 12:15 PM  Result Value Ref Range   C Diff antigen NEGATIVE NEGATIVE   C Diff toxin NEGATIVE NEGATIVE   C Diff interpretation No C. difficile detected.        IMAGING Ct Abdomen Pelvis W Contrast  Result Date: 08/02/2017 CLINICAL DATA:  Status post hysterectomy on 07/31/2017. Abdominal pain and cramping with nausea and vomiting. EXAM: CT ABDOMEN AND PELVIS WITH CONTRAST TECHNIQUE: Multidetector CT imaging of the abdomen and pelvis was performed using the standard protocol following bolus administration of intravenous contrast. CONTRAST:  ISOVUE-300 IOPAMIDOL (ISOVUE-300) INJECTION 61% COMPARISON:  07/07/2012 FINDINGS: Lower chest: Heart unremarkable no pleural effusion or pneumothorax.  Hepatobiliary: No focal abnormality within the liver parenchyma. There is no evidence for gallstones, gallbladder wall thickening, or pericholecystic fluid. No intrahepatic or extrahepatic biliary dilation. Pancreas: No focal mass lesion. No dilatation of the main duct. No intraparenchymal cyst. No peripancreatic edema. Spleen: No splenomegaly. No focal mass lesion. Adrenals/Urinary Tract: No adrenal nodule or mass. Kidneys unremarkable No evidence for hydroureter. No evidence for hydroureter or contrast extravasation from either ureter. The urinary bladder appears normal for the degree of distention. Delayed imaging shows good bladder distention with opacification of the bladder lumen. No evidence for focal bladder wall thickening or irregularity. No evidence for intraperitoneal or extraperitoneal bladder leak. Stomach/Bowel: Stomach is nondistended. No gastric wall thickening. No evidence of outlet obstruction. Duodenum is normally positioned as is the ligament of Treitz. No small bowel wall thickening. No small bowel dilatation. The terminal ileum is normal. The appendix is normal. No gross colonic mass. No colonic wall thickening. No substantial diverticular change. Vascular/Lymphatic: No abdominal aortic aneurysm. No abdominal aortic atherosclerotic calcification. There is no gastrohepatic or hepatoduodenal ligament lymphadenopathy. No intraperitoneal or retroperitoneal lymphadenopathy. No pelvic sidewall lymphadenopathy. Reproductive: Uterus surgically absent.  There is no adnexal mass. Other: Trace free fluid is seen in the cul-de-sac, not unexpected postoperative day 2 from hysterectomy. No focal or rim enhancing fluid collection in the pelvis. Extraperitoneal gas in the pelvic floor is associated with intraperitoneal free gas and gas within the right anterior abdominal wall subcutaneous fat. Intra and extraperitoneal gas not unexpected 2 days out from surgery. Musculoskeletal: Bone windows reveal no  worrisome lytic or sclerotic osseous lesions. IMPRESSION: 1. Expected postoperative appearance in this patient 2 days out from total laparoscopic hysterectomy with bilateral salpingectomy. Specifically, no evidence for hemoperitoneum with only trace fluid noted in the  cul-de-sac. No focal hematoma or rim enhancing fluid collection in the pelvis. No bowel obstruction or bowel wall thickening. No evidence for ureteral or bladder injury. 2. There is gas scattered in the extraperitoneal pelvic floor, a small volume of intraperitoneal gas, and gas in the subcutaneous fat of the anterior abdominal wall, not unexpected 2 days after surgery. Electronically Signed   By: Kennith CenterEric  Mansell M.D.   On: 08/02/2017 14:21    MAU Management/MDM: CBC, CMP, UA Results wnl, no elevated WBCs, electrolytes wnl Dilaudid 1 mg IV given for pain, pt reports improvement in pain NS x 1000 ml bolus CT abdomen/pelvis with contrast with normal postoperative results Consult Dr Oscar LaJertson with assessment and findings D/C home with pain management with Dilaudid 2 mg Q 4 hours PRN x 10 tabs and Phenergan 12.5-25 mg Q 6 hours PRN May take OTC imodium PRN F/U in office in 1 week, return to MAU if pain or symptoms worsen Pt discharged with strict infection precautions.  ASSESSMENT 1. Postoperative abdominal pain   2. Diarrhea in adult patient   3. Nausea without vomiting     PLAN Discharge home Allergies as of 08/02/2017   No Known Allergies     Medication List    STOP taking these medications   docusate sodium 100 MG capsule Commonly known as:  COLACE     TAKE these medications   fexofenadine 180 MG tablet Commonly known as:  ALLEGRA Take 180 mg by mouth daily as needed for allergies.   HYDROmorphone 2 MG tablet Commonly known as:  DILAUDID Take 1 tablet (2 mg total) by mouth every 4 (four) hours as needed for severe pain.   ibuprofen 800 MG tablet Commonly known as:  ADVIL,MOTRIN Take 1 tablet (800 mg total) by  mouth every 8 (eight) hours as needed.   multivitamin tablet Take 1 tablet by mouth daily.   ondansetron 4 MG tablet Commonly known as:  ZOFRAN Take 1 tablet (4 mg total) by mouth every 8 (eight) hours as needed for nausea.   oxyCODONE-acetaminophen 5-325 MG tablet Commonly known as:  PERCOCET/ROXICET Take 1-2 tablets by mouth every 4 (four) hours as needed for severe pain ((when tolerating fluids)).   promethazine 25 MG tablet Commonly known as:  PHENERGAN Take 0.5-1 tablets (12.5-25 mg total) by mouth every 6 (six) hours as needed.   valACYclovir 500 MG tablet Commonly known as:  VALTREX Take 1 tablet (500 mg total) by mouth daily. Increase to BID for 3 days as needed What changed:  additional instructions   VITAMIN D3 PO Take 1 capsule by mouth daily.      Follow-up Information    Romualdo BolkJertson, Jill Evelyn, MD Follow up.   Specialty:  Obstetrics and Gynecology Why:  in 1 week, return to MAU as needed for emergencies Contact information: [address] [place] [id number] [phone]           Sharen CounterLisa Leftwich-Kirby Certified Nurse-Midwife 08/02/2017  7:27 PM

## 2017-08-03 ENCOUNTER — Telehealth: Payer: Self-pay | Admitting: Obstetrics and Gynecology

## 2017-08-03 NOTE — Telephone Encounter (Signed)
Patient is asking for the status of her FMLA paperwork? I informed patient that Thomasene LotSuzy will be out of the office until Monday 07/07/17.

## 2017-08-03 NOTE — Telephone Encounter (Signed)
Called the patient to check on her. She is feeling better today. The dilaudid is helping and she started gas-ex and feels that is helping as well.

## 2017-08-08 ENCOUNTER — Ambulatory Visit (INDEPENDENT_AMBULATORY_CARE_PROVIDER_SITE_OTHER): Payer: 59 | Admitting: Obstetrics and Gynecology

## 2017-08-08 ENCOUNTER — Other Ambulatory Visit: Payer: Self-pay

## 2017-08-08 ENCOUNTER — Encounter: Payer: Self-pay | Admitting: Obstetrics and Gynecology

## 2017-08-08 VITALS — BP 140/80 | HR 80 | Resp 14 | Wt 205.0 lb

## 2017-08-08 DIAGNOSIS — Z90710 Acquired absence of both cervix and uterus: Secondary | ICD-10-CM

## 2017-08-08 NOTE — Progress Notes (Signed)
GYNECOLOGY  VISIT   HPI: 27 y.o.   Married  [place]  female   [phone] with Patient's last menstrual period was 07/17/2017 (exact date).   here for follow up. Patient is 8 days s/p TLH/BS. Doing well. No pain, voiding fine. She is constipated, some pain with passing stool.   GYNECOLOGIC HISTORY: Patient's last menstrual period was 07/17/2017 (exact date). Contraception:Hysterectomy  Menopausal hormone therapy: none        OB History    Gravida Para Term Preterm AB Living   3 3 0 3   3   SAB TAB Ectopic Multiple Live Births           3         Patient Active Problem List   Diagnosis Date Noted  . Status post laparoscopic hysterectomy 07/31/2017  . Other and unspecified ovarian cyst 07/19/2012  . Heart murmur 03/29/2011  . Chest pain 03/29/2011  . GERD (gastroesophageal reflux disease) 03/29/2011    Past Medical History:  Diagnosis Date  . Anxiety    no meds  . Cardiac murmur    no problems  . Depression    no meds  . GERD (gastroesophageal reflux disease)    no meds, diet controlled  . HSV infection   . Otitis   . PCOS (polycystic ovarian syndrome)   . Seasonal allergies   . SVD (spontaneous vaginal delivery)    x 3  . Urticaria     Past Surgical History:  Procedure Laterality Date  . CYSTOSCOPY N/A 07/31/2017   Procedure: CYSTOSCOPY;  Surgeon: Romualdo Bolk, MD;  Location: [facility];  Service: Gynecology;  Laterality: N/A;  . ESSURE TUBAL LIGATION    . TOOTH EXTRACTION    . TOTAL LAPAROSCOPIC HYSTERECTOMY WITH SALPINGECTOMY Bilateral 07/31/2017   Procedure: TOTAL LAPAROSCOPIC HYSTERECTOMY WITH SALPINGECTOMY;  Surgeon: Romualdo Bolk, MD;  Location: [facility];  Service: Gynecology;  Laterality: Bilateral;    Current Outpatient Medications  Medication Sig Dispense Refill  . Cholecalciferol (VITAMIN D3 PO) Take 1 capsule by mouth daily.    . fexofenadine (ALLEGRA) 180 MG tablet Take 180 mg by mouth daily as needed for allergies.     Marland Kitchen ibuprofen  (ADVIL,MOTRIN) 800 MG tablet Take 1 tablet (800 mg total) by mouth every 8 (eight) hours as needed. 30 tablet 1  . Multiple Vitamin (MULTIVITAMIN) tablet Take 1 tablet by mouth daily.      . promethazine (PHENERGAN) 25 MG tablet Take 0.5-1 tablets (12.5-25 mg total) by mouth every 6 (six) hours as needed. 30 tablet 2  . valACYclovir (VALTREX) 500 MG tablet Take 1 tablet (500 mg total) by mouth daily. Increase to BID for 3 days as needed (Patient taking differently: Take 500 mg by mouth daily. ) 90 tablet 3   No current facility-administered medications for this visit.      ALLERGIES: Patient has no known allergies.  Family History  Problem Relation Age of Onset  . Heart murmur Father   . Blindness Mother 59  . Heart murmur Paternal Grandmother   . Heart failure Paternal Aunt     Social History   Socioeconomic History  . Marital status: Married    Spouse name: Not on file  . Number of children: Not on file  . Years of education: Not on file  . Highest education level: Not on file  Social Needs  . Financial resource strain: Not on file  . Food insecurity - worry: Not on file  .  Food insecurity - inability: Not on file  . Transportation needs - medical: Not on file  . Transportation needs - non-medical: Not on file  Occupational History  . Occupation: [employer]    Comment: Aeronautical engineerAccounts receivable  Tobacco Use  . Smoking status: Never Smoker  . Smokeless tobacco: Never Used  Substance and Sexual Activity  . Alcohol use: Yes    Alcohol/week: 0.0 oz    Comment: twice a month   . Drug use: No  . Sexual activity: Yes    Partners: Male    Birth control/protection: Surgical    Comment: essure tubal   Other Topics Concern  . Not on file  Social History Narrative  . Not on file    Review of Systems  Constitutional: Negative.   HENT: Negative.   Eyes: Negative.   Respiratory: Negative.   Cardiovascular: Negative.   Gastrointestinal: Positive for constipation.  Genitourinary:  Negative.   Musculoskeletal: Negative.   Skin: Negative.   Neurological: Negative.   Endo/Heme/Allergies: Negative.   Psychiatric/Behavioral: Negative.     PHYSICAL EXAMINATION:    BP 140/80 (BP Location: Right Arm, Patient Position: Sitting, Cuff Size: Normal)   Pulse 80   Resp 14   Wt 205 lb (93 kg)   LMP 07/17/2017 (Exact Date)   BMI 36.31 kg/m     General appearance: alert, cooperative and appears stated age Abdomen: soft, non-tender; non distended, no masses,  no organomegaly Incisions are healing well.    ASSESSMENT 8 days s/p TLH/BS, doing well. Reviewed the negative pathology    PLAN F/U in 3 weeks for post op check She wants to resume work next week from home   An After Visit Summary was printed and given to the patient.

## 2017-08-08 NOTE — Telephone Encounter (Signed)
Leave of Absence form faxed to [employer], attention Halina AndreasGayle Kretz at fax number [phone] on 08/07/17, confirmation received transmittal was successful. Ok to close

## 2017-08-28 ENCOUNTER — Ambulatory Visit: Payer: 59 | Admitting: Obstetrics and Gynecology

## 2017-08-31 ENCOUNTER — Encounter: Payer: Self-pay | Admitting: Obstetrics and Gynecology

## 2017-08-31 ENCOUNTER — Ambulatory Visit (INDEPENDENT_AMBULATORY_CARE_PROVIDER_SITE_OTHER): Payer: Self-pay | Admitting: Obstetrics and Gynecology

## 2017-08-31 ENCOUNTER — Other Ambulatory Visit: Payer: Self-pay

## 2017-08-31 VITALS — BP 120/60 | HR 88 | Resp 16 | Wt 208.0 lb

## 2017-08-31 DIAGNOSIS — R232 Flushing: Secondary | ICD-10-CM

## 2017-08-31 DIAGNOSIS — N898 Other specified noninflammatory disorders of vagina: Secondary | ICD-10-CM

## 2017-08-31 DIAGNOSIS — Z90710 Acquired absence of both cervix and uterus: Secondary | ICD-10-CM

## 2017-08-31 DIAGNOSIS — R61 Generalized hyperhidrosis: Secondary | ICD-10-CM

## 2017-08-31 NOTE — Progress Notes (Signed)
GYNECOLOGY  VISIT   HPI: 27 y.o.   Married  [place]  female   [phone] with Patient's last menstrual period was 07/17/2017 (exact date).   here for follow up. Patient is 1 month s/p TLH    She self treated for a yeast infection, had some burning with the treatment, some bleeding with treatment. Currently she has a brownish discharge, no itching, burning or irritation. She had lower back pain prior to the surgery, got worse after the surgery, slowly getting better.  No abdominal pain. No urinary c/o, no bowel issues.  She is c/o bad hot flashes and night sweats 4-6 x a night.   GYNECOLOGIC HISTORY: Patient's last menstrual period was 07/17/2017 (exact date). Contraception:hysterectomy  Menopausal hormone therapy: none         OB History    Gravida Para Term Preterm AB Living   3 3 0 3   3   SAB TAB Ectopic Multiple Live Births           3         Patient Active Problem List   Diagnosis Date Noted  . Status post laparoscopic hysterectomy 07/31/2017  . Other and unspecified ovarian cyst 07/19/2012  . Heart murmur 03/29/2011  . Chest pain 03/29/2011  . GERD (gastroesophageal reflux disease) 03/29/2011    Past Medical History:  Diagnosis Date  . Anxiety    no meds  . Cardiac murmur    no problems  . Depression    no meds  . GERD (gastroesophageal reflux disease)    no meds, diet controlled  . HSV infection   . Otitis   . PCOS (polycystic ovarian syndrome)   . Seasonal allergies   . SVD (spontaneous vaginal delivery)    x 3  . Urticaria     Past Surgical History:  Procedure Laterality Date  . ABDOMINAL HYSTERECTOMY    . CYSTOSCOPY N/A 07/31/2017   Procedure: CYSTOSCOPY;  Surgeon: Romualdo Bolk, MD;  Location: [facility];  Service: Gynecology;  Laterality: N/A;  . ESSURE TUBAL LIGATION    . TOOTH EXTRACTION    . TOTAL LAPAROSCOPIC HYSTERECTOMY WITH SALPINGECTOMY Bilateral 07/31/2017   Procedure: TOTAL LAPAROSCOPIC HYSTERECTOMY WITH SALPINGECTOMY;   Surgeon: Romualdo Bolk, MD;  Location: [facility];  Service: Gynecology;  Laterality: Bilateral;    Current Outpatient Medications  Medication Sig Dispense Refill  . Cholecalciferol (VITAMIN D3 PO) Take 1 capsule by mouth daily.    . fexofenadine (ALLEGRA) 180 MG tablet Take 180 mg by mouth daily as needed for allergies.     Marland Kitchen ibuprofen (ADVIL,MOTRIN) 800 MG tablet Take 1 tablet (800 mg total) by mouth every 8 (eight) hours as needed. 30 tablet 1  . Multiple Vitamin (MULTIVITAMIN) tablet Take 1 tablet by mouth daily.      . promethazine (PHENERGAN) 25 MG tablet Take 0.5-1 tablets (12.5-25 mg total) by mouth every 6 (six) hours as needed. 30 tablet 2  . valACYclovir (VALTREX) 500 MG tablet Take 1 tablet (500 mg total) by mouth daily. Increase to BID for 3 days as needed (Patient taking differently: Take 500 mg by mouth daily. ) 90 tablet 3   No current facility-administered medications for this visit.      ALLERGIES: Patient has no known allergies.  Family History  Problem Relation Age of Onset  . Heart murmur Father   . Blindness Mother 90  . Heart murmur Paternal Grandmother   . Heart failure Paternal Aunt  Social History   Socioeconomic History  . Marital status: Married    Spouse name: Not on file  . Number of children: Not on file  . Years of education: Not on file  . Highest education level: Not on file  Social Needs  . Financial resource strain: Not on file  . Food insecurity - worry: Not on file  . Food insecurity - inability: Not on file  . Transportation needs - medical: Not on file  . Transportation needs - non-medical: Not on file  Occupational History  . Occupation: [employer]    Comment: Aeronautical engineerAccounts receivable  Tobacco Use  . Smoking status: Never Smoker  . Smokeless tobacco: Never Used  Substance and Sexual Activity  . Alcohol use: Yes    Alcohol/week: 0.0 oz    Comment: twice a month   . Drug use: No  . Sexual activity: Yes    Partners: Male    Birth  control/protection: Surgical    Comment: essure tubal   Other Topics Concern  . Not on file  Social History Narrative  . Not on file    Review of Systems  Constitutional: Negative.   HENT: Negative.   Eyes: Negative.   Respiratory: Negative.   Cardiovascular: Negative.   Gastrointestinal: Negative.   Genitourinary:       Hot flashes  Vaginal discharge and irritation  Spotting after monistat use   Musculoskeletal: Negative.   Skin: Negative.   Neurological: Negative.   Endo/Heme/Allergies: Negative.   Psychiatric/Behavioral: Negative.     PHYSICAL EXAMINATION:    BP 120/60 (BP Location: Right Arm, Patient Position: Sitting, Cuff Size: Large)   Pulse 88   Resp 16   Wt 208 lb (94.3 kg)   LMP 07/17/2017 (Exact Date)   BMI 36.85 kg/m     General appearance: alert, cooperative and appears stated age Abdomen: soft, non-tender; non distended, no masses,  no organomegaly  Pelvic: External genitalia:  no lesions              Urethra:  normal appearing urethra with no masses, tenderness or lesions              Bartholins and Skenes: normal                 Vagina: normal appearing vagina with normal color. Slight increase in watery, brown vaginal d/c. Cuff is healing well, no granulation tissue seen. Not tender with palpation of the cuff.               Bimanual Exam:  Uterus:  uterus absent              Adnexa: no mass, fullness, tenderness                Chaperone was present for exam.  ASSESSMENT 1 month s/p TLH/BS, overall doing well Brown d/c, no source of bleeding seen Vasomotor symptoms    PLAN Affirm FSH, estradiol Continue pelvic rest F/U in one month for repeat exam   An After Visit Summary was printed and given to the patient.

## 2017-09-01 LAB — VAGINITIS/VAGINOSIS, DNA PROBE
CANDIDA SPECIES: NEGATIVE
Gardnerella vaginalis: NEGATIVE
Trichomonas vaginosis: NEGATIVE

## 2017-09-01 LAB — FOLLICLE STIMULATING HORMONE: FSH: 5.5 m[IU]/mL

## 2017-09-01 LAB — ESTRADIOL: Estradiol: 55.7 pg/mL

## 2017-09-15 ENCOUNTER — Other Ambulatory Visit: Payer: Self-pay | Admitting: *Deleted

## 2017-09-15 MED ORDER — IBUPROFEN 800 MG PO TABS: 800.0000 mg | ORAL_TABLET | Freq: Three times a day (TID) | ORAL | 1 refills | Status: DC | PRN

## 2017-09-15 NOTE — Telephone Encounter (Signed)
Medication refill request: ibuprofen  Last OV:  08/31/17 JJ  Next OV: 09/28/17  Last MMG (if hormonal medication request): n/a Refill authorized: 07/31/17 #30, 1 RF. Today, please advise.

## 2017-09-28 ENCOUNTER — Ambulatory Visit: Payer: 59 | Admitting: Obstetrics and Gynecology

## 2017-09-28 ENCOUNTER — Encounter: Payer: Self-pay | Admitting: Obstetrics and Gynecology

## 2017-09-28 ENCOUNTER — Other Ambulatory Visit: Payer: Self-pay

## 2017-09-28 VITALS — BP 130/62 | HR 76 | Wt 215.0 lb

## 2017-09-28 DIAGNOSIS — Z90710 Acquired absence of both cervix and uterus: Secondary | ICD-10-CM

## 2017-09-28 DIAGNOSIS — M545 Low back pain: Secondary | ICD-10-CM

## 2017-09-28 NOTE — Progress Notes (Signed)
GYNECOLOGY  VISIT   HPI: 27 y.o.   Married  [place]  female   [phone] with Patient's last menstrual period was 07/17/2017 (exact date).   here for follow up s/p TLH 2 months ago. Patient still complaining of some low back pain. It is improving, at times bad. 3 out of 7 days are really bad, hurts most when she goes from sitting to standing. Up to a 7/10 in severity, can last for 1-2 hours. Corrects itself if she changes position.  No abdominal pain,d/c is better. No bleeding. Bowel and bladder are okay. Bowels are actually better, having a BM every day. Her energy is improving.   GYNECOLOGIC HISTORY: Patient's last menstrual period was 07/17/2017 (exact date). Contraception:Hysterectomy Menopausal hormone therapy: none        OB History    Gravida  3   Para  3   Term  0   Preterm  3   AB      Living  3     SAB      TAB      Ectopic      Multiple      Live Births  3              Patient Active Problem List   Diagnosis Date Noted  . Status post laparoscopic hysterectomy 07/31/2017  . Other and unspecified ovarian cyst 07/19/2012  . Heart murmur 03/29/2011  . Chest pain 03/29/2011  . GERD (gastroesophageal reflux disease) 03/29/2011    Past Medical History:  Diagnosis Date  . Anxiety    no meds  . Cardiac murmur    no problems  . Depression    no meds  . GERD (gastroesophageal reflux disease)    no meds, diet controlled  . HSV infection   . Otitis   . PCOS (polycystic ovarian syndrome)   . Seasonal allergies   . SVD (spontaneous vaginal delivery)    x 3  . Urticaria     Past Surgical History:  Procedure Laterality Date  . ABDOMINAL HYSTERECTOMY    . CYSTOSCOPY N/A 07/31/2017   Procedure: CYSTOSCOPY;  Surgeon: Romualdo Bolk, MD;  Location: [facility];  Service: Gynecology;  Laterality: N/A;  . ESSURE TUBAL LIGATION    . TOOTH EXTRACTION    . TOTAL LAPAROSCOPIC HYSTERECTOMY WITH SALPINGECTOMY Bilateral 07/31/2017   Procedure: TOTAL  LAPAROSCOPIC HYSTERECTOMY WITH SALPINGECTOMY;  Surgeon: Romualdo Bolk, MD;  Location: [facility];  Service: Gynecology;  Laterality: Bilateral;    Current Outpatient Medications  Medication Sig Dispense Refill  . Cholecalciferol (VITAMIN D3 PO) Take 1 capsule by mouth daily.    . fexofenadine (ALLEGRA) 180 MG tablet Take 180 mg by mouth daily as needed for allergies.     Marland Kitchen ibuprofen (ADVIL,MOTRIN) 800 MG tablet Take 1 tablet (800 mg total) by mouth every 8 (eight) hours as needed. 30 tablet 1  . Multiple Vitamin (MULTIVITAMIN) tablet Take 1 tablet by mouth daily.      . valACYclovir (VALTREX) 500 MG tablet Take 1 tablet (500 mg total) by mouth daily. Increase to BID for 3 days as needed (Patient taking differently: Take 500 mg by mouth daily. ) 90 tablet 3   No current facility-administered medications for this visit.      ALLERGIES: Patient has no known allergies.  Family History  Problem Relation Age of Onset  . Heart murmur Father   . Blindness Mother 59  . Heart murmur Paternal Grandmother   . Heart  failure Paternal Aunt     Social History   Socioeconomic History  . Marital status: Married    Spouse name: Not on file  . Number of children: Not on file  . Years of education: Not on file  . Highest education level: Not on file  Occupational History  . Occupation: [employer]    Comment: Aeronautical engineer  Social Needs  . Financial resource strain: Not on file  . Food insecurity:    Worry: Not on file    Inability: Not on file  . Transportation needs:    Medical: Not on file    Non-medical: Not on file  Tobacco Use  . Smoking status: Never Smoker  . Smokeless tobacco: Never Used  Substance and Sexual Activity  . Alcohol use: Yes    Alcohol/week: 0.0 oz    Comment: twice a month   . Drug use: No  . Sexual activity: Yes    Partners: Male    Birth control/protection: Surgical    Comment: essure tubal   Lifestyle  . Physical activity:    Days per week: Not on  file    Minutes per session: Not on file  . Stress: Not on file  Relationships  . Social connections:    Talks on phone: Not on file    Gets together: Not on file    Attends religious service: Not on file    Active member of club or organization: Not on file    Attends meetings of clubs or organizations: Not on file    Relationship status: Not on file  . Intimate partner violence:    Fear of current or ex partner: Not on file    Emotionally abused: Not on file    Physically abused: Not on file    Forced sexual activity: Not on file  Other Topics Concern  . Not on file  Social History Narrative  . Not on file    Review of Systems  Constitutional: Negative.   HENT: Negative.   Eyes: Negative.   Respiratory: Negative.   Cardiovascular: Negative.   Gastrointestinal: Negative.   Genitourinary: Negative.   Musculoskeletal: Positive for back pain.  Skin: Negative.   Neurological: Positive for headaches.  Endo/Heme/Allergies: Negative.   Psychiatric/Behavioral: Negative.     PHYSICAL EXAMINATION:    BP 130/62 (BP Location: Right Arm, Patient Position: Sitting, Cuff Size: Large)   Pulse 76   Wt 215 lb (97.5 kg)   LMP 07/17/2017 (Exact Date)   BMI 38.09 kg/m     General appearance: alert, cooperative and appears stated age Abdomen: soft, non-tender; non distended, no masses,  no organomegaly. Incisions well healed Lower back: not tender to palpation  Pelvic: External genitalia:  no lesions              Urethra:  normal appearing urethra with no masses, tenderness or lesions              Bartholins and Skenes: normal                 Vagina: normal appearing vagina with normal color and discharge, no lesions. Cuff is healing well              Cervix: absent              Bimanual Exam:  Uterus:  uterus absent              Adnexa: no mass, fullness, tenderness  Chaperone was present for exam.  ASSESSMENT 8 weeks s/p TLH, doing well, feeling much  better Lower back pain, worse after surgery    PLAN No intercourse for one more month F/U in 6 months for an annual exam Can increase activity as tolerated Can restart yoga Referral for PT sent. She will let me know if she isn't continuing to improve   An After Visit Summary was printed and given to the patient.

## 2017-09-28 NOTE — Patient Instructions (Signed)

## 2017-10-05 ENCOUNTER — Telehealth: Payer: Self-pay | Admitting: Obstetrics and Gynecology

## 2017-10-05 ENCOUNTER — Emergency Department (HOSPITAL_COMMUNITY)
Admission: EM | Admit: 2017-10-05 | Discharge: 2017-10-05 | Disposition: A | Discharge status: Home / Self Care | Payer: 59 | Attending: Emergency Medicine | Admitting: Emergency Medicine

## 2017-10-05 ENCOUNTER — Emergency Department (HOSPITAL_COMMUNITY): Payer: 59

## 2017-10-05 ENCOUNTER — Encounter (HOSPITAL_COMMUNITY): Payer: Self-pay | Admitting: *Deleted

## 2017-10-05 DIAGNOSIS — M79652 Pain in left thigh: Secondary | ICD-10-CM | POA: Diagnosis present

## 2017-10-05 DIAGNOSIS — Z79899 Other long term (current) drug therapy: Secondary | ICD-10-CM | POA: Insufficient documentation

## 2017-10-05 DIAGNOSIS — M779 Enthesopathy, unspecified: Secondary | ICD-10-CM | POA: Diagnosis not present

## 2017-10-05 DIAGNOSIS — R1032 Left lower quadrant pain: Secondary | ICD-10-CM | POA: Diagnosis not present

## 2017-10-05 LAB — URINALYSIS, ROUTINE W REFLEX MICROSCOPIC
Bilirubin Urine: NEGATIVE
GLUCOSE, UA: NEGATIVE mg/dL
Hgb urine dipstick: NEGATIVE
Ketones, ur: NEGATIVE mg/dL
Leukocytes, UA: NEGATIVE
Nitrite: NEGATIVE
Protein, ur: NEGATIVE mg/dL
SPECIFIC GRAVITY, URINE: 1.012 (ref 1.005–1.030)
pH: 6 (ref 5.0–8.0)

## 2017-10-05 LAB — COMPREHENSIVE METABOLIC PANEL
ALT: 20 U/L (ref 14–54)
AST: 20 U/L (ref 15–41)
Albumin: 4.4 g/dL (ref 3.5–5.0)
Alkaline Phosphatase: 57 U/L (ref 38–126)
Anion gap: 8 (ref 5–15)
BUN: 10 mg/dL (ref 6–20)
CHLORIDE: 108 mmol/L (ref 101–111)
CO2: 23 mmol/L (ref 22–32)
CREATININE: 0.79 mg/dL (ref 0.44–1.00)
Calcium: 9.1 mg/dL (ref 8.9–10.3)
GFR calc Af Amer: >60 mL/min (ref >60)
GFR calc non Af Amer: >60 mL/min (ref >60)
Glucose, Bld: 91 mg/dL (ref 65–99)
Potassium: 4 mmol/L (ref 3.5–5.1)
SODIUM: 139 mmol/L (ref 135–145)
Total Bilirubin: 1.3 mg/dL — ABNORMAL HIGH (ref 0.3–1.2)
Total Protein: 7.8 g/dL (ref 6.5–8.1)

## 2017-10-05 LAB — CBC WITH DIFFERENTIAL/PLATELET
BASOS ABS: 0 10*3/uL (ref 0.0–0.1)
Basophils Relative: 0 %
EOS ABS: 0.1 10*3/uL (ref 0.0–0.7)
EOS PCT: 2 %
HCT: 43.6 % (ref 36.0–46.0)
Hemoglobin: 14.6 g/dL (ref 12.0–15.0)
LYMPHS PCT: 43 %
Lymphs Abs: 2 10*3/uL (ref 0.7–4.0)
MCH: 31.1 pg (ref 26.0–34.0)
MCHC: 33.5 g/dL (ref 30.0–36.0)
MCV: 93 fL (ref 78.0–100.0)
Monocytes Absolute: 0.4 10*3/uL (ref 0.1–1.0)
Monocytes Relative: 9 %
Neutro Abs: 2.1 10*3/uL (ref 1.7–7.7)
Neutrophils Relative %: 46 %
PLATELETS: 257 10*3/uL (ref 150–400)
RBC: 4.69 MIL/uL (ref 3.87–5.11)
RDW: 13.5 % (ref 11.5–15.5)
WBC: 4.6 10*3/uL (ref 4.0–10.5)

## 2017-10-05 LAB — LIPASE, BLOOD: Lipase: 30 U/L (ref 11–51)

## 2017-10-05 MED ORDER — IBUPROFEN 200 MG PO TABS
600.0000 mg | ORAL_TABLET | Freq: Once | ORAL | Status: AC
  Administered 2017-10-05: 600 mg via ORAL
  Filled 2017-10-05: qty 3

## 2017-10-05 MED ORDER — IBUPROFEN 600 MG PO TABS: 600.0000 mg | ORAL_TABLET | Freq: Four times a day (QID) | ORAL | 0 refills | Status: DC | PRN

## 2017-10-05 MED ORDER — PREDNISONE 10 MG (21) PO TBPK: ORAL_TABLET | ORAL | 0 refills | Status: AC

## 2017-10-05 MED ORDER — SODIUM CHLORIDE 0.9 % IV BOLUS
1000.0000 mL | Freq: Once | INTRAVENOUS | Status: AC
  Administered 2017-10-05: 1000 mL via INTRAVENOUS

## 2017-10-05 MED ORDER — MORPHINE SULFATE (PF) 4 MG/ML IV SOLN
4.0000 mg | Freq: Once | INTRAVENOUS | Status: AC
Start: 2017-10-05 — End: 2017-10-05
  Administered 2017-10-05: 4 mg via INTRAVENOUS
  Filled 2017-10-05: qty 1

## 2017-10-05 MED ORDER — ONDANSETRON HCL 4 MG/2ML IJ SOLN
4.0000 mg | Freq: Once | INTRAMUSCULAR | Status: AC
  Administered 2017-10-05: 4 mg via INTRAVENOUS
  Filled 2017-10-05: qty 2

## 2017-10-05 MED ORDER — IOPAMIDOL (ISOVUE-300) INJECTION 61%
INTRAVENOUS | Status: AC
  Filled 2017-10-05: qty 100

## 2017-10-05 MED ORDER — IOPAMIDOL (ISOVUE-300) INJECTION 61%
100.0000 mL | Freq: Once | INTRAVENOUS | Status: AC | PRN
  Administered 2017-10-05: 100 mL via INTRAVENOUS

## 2017-10-05 NOTE — Telephone Encounter (Signed)
Patient is having extreme left pelvic and leg pain.

## 2017-10-05 NOTE — ED Triage Notes (Signed)
Pt complains of left hip pain, radiating down her left leg for the past 4 days. Pt states she has noticed swelling below the surgical site of her hysterectomy, which was performed in late January. Pt denies fever, nausea, emesis, states she feels chills with the pain.

## 2017-10-05 NOTE — Telephone Encounter (Signed)
The patient was seen in the ER, she had a negative abdominal/pelvic CT. Treated for tendonitis

## 2017-10-05 NOTE — Telephone Encounter (Signed)
Reviewed with Dr. Oscar LaJertson, normal pelvic exam on 09/28/17, hx bilateral lower back pain prior to surgery. PT referral placed 09/28/17. Can be seen in office today, if pain is severe should seek immediate [facility], pain likely musculoskeletal.    Call returned to patient, advised as seen above per Dr. Oscar LaJertson. Patient [facility]. Advised to return call to office with any additional questions/concerns. Patient thankful, verbalizes understanding.  Routing to provider for final review. Patient is agreeable to disposition. Will close encounter.

## 2017-10-05 NOTE — ED Provider Notes (Signed)
[facility]-EMERGENCY DEPT Provider Note   CSN: [id number] Arrival date & time: 10/05/17  [id number]     History   Chief Complaint Chief Complaint  Patient presents with  . Leg Pain  . Post-op Problem    HPI Christina Mcdaniel is a 27 y.o. female.  Pt presents to the ED today with left lower abdominal pain.  The pt said she noticed it about 4 days ago.  She points to above groin crease to left leg.  She said it is more swollen when she stands up.  She did start yoga 1 week ago.  She also had a hysterectomy in January.  She denies lower leg pain.       Past Medical History:  Diagnosis Date  . Anxiety    no meds  . Cardiac murmur    no problems  . Depression    no meds  . GERD (gastroesophageal reflux disease)    no meds, diet controlled  . HSV infection   . Otitis   . PCOS (polycystic ovarian syndrome)   . Seasonal allergies   . SVD (spontaneous vaginal delivery)    x 3  . Urticaria     Patient Active Problem List   Diagnosis Date Noted  . Status post laparoscopic hysterectomy 07/31/2017  . Other and unspecified ovarian cyst 07/19/2012  . Heart murmur 03/29/2011  . Chest pain 03/29/2011  . GERD (gastroesophageal reflux disease) 03/29/2011    Past Surgical History:  Procedure Laterality Date  . ABDOMINAL HYSTERECTOMY    . CYSTOSCOPY N/A 07/31/2017   Procedure: CYSTOSCOPY;  Surgeon: Romualdo BolkJertson, Jill Evelyn, MD;  Location: [facility];  Service: Gynecology;  Laterality: N/A;  . ESSURE TUBAL LIGATION    . TOOTH EXTRACTION    . TOTAL LAPAROSCOPIC HYSTERECTOMY WITH SALPINGECTOMY Bilateral 07/31/2017   Procedure: TOTAL LAPAROSCOPIC HYSTERECTOMY WITH SALPINGECTOMY;  Surgeon: Romualdo BolkJertson, Jill Evelyn, MD;  Location: [facility];  Service: Gynecology;  Laterality: Bilateral;     OB History    Gravida  3   Para  3   Term  0   Preterm  3   AB      Living  3     SAB      TAB      Ectopic      Multiple      Live Births  3            Home  Medications    Prior to Admission medications   Medication Sig Start Date End Date Taking? Authorizing Provider  Cholecalciferol (VITAMIN D3 PO) Take 1 capsule by mouth daily.   Yes [provider]  fexofenadine (ALLEGRA) 180 MG tablet Take 180 mg by mouth daily as needed for allergies.  05/23/15  Yes [provider]  Multiple Vitamin (MULTIVITAMIN) tablet Take 1 tablet by mouth daily.     Yes [provider]  valACYclovir (VALTREX) 500 MG tablet Take 1 tablet (500 mg total) by mouth daily. Increase to BID for 3 days as needed Patient taking differently: Take 500 mg by mouth daily.  06/06/17  Yes Romualdo BolkJertson, Jill Evelyn, MD  ibuprofen (ADVIL,MOTRIN) 600 MG tablet Take 1 tablet (600 mg total) by mouth every 6 (six) hours as needed. 10/05/17   Jacalyn LefevreHaviland, Kenyan Karnes, MD  predniSONE (STERAPRED UNI-PAK 21 TAB) 10 MG (21) TBPK tablet Take 6 tabs by mouth daily  for 2 days, then 5 tabs for 2 days, then 4 tabs for 2 days, then 3 tabs for 2  days, 2 tabs for 2 days, then 1 tab by mouth daily for 2 days 10/05/17   Jacalyn Lefevre, MD    Family History Family History  Problem Relation Age of Onset  . Heart murmur Father   . Blindness Mother 71  . Heart murmur Paternal Grandmother   . Heart failure Paternal Aunt     Social History Social History   Tobacco Use  . Smoking status: Never Smoker  . Smokeless tobacco: Never Used  Substance Use Topics  . Alcohol use: Yes    Alcohol/week: 0.0 oz    Comment: twice a month   . Drug use: No     Allergies   Patient has no known allergies.   Review of Systems Review of Systems  Gastrointestinal: Positive for abdominal pain.  All other systems reviewed and are negative.    Physical Exam Updated Vital Signs BP 120/66 (BP Location: Right Arm)   Pulse 83   Temp 98 F (36.7 C) (Oral)   Resp 16   LMP 07/17/2017 (Exact Date)   SpO2 98%   Physical Exam  Constitutional: She is oriented to person, place, and time. She appears  well-developed and well-nourished.  HENT:  Head: Normocephalic and atraumatic.  Right Ear: External ear normal.  Left Ear: External ear normal.  Nose: Nose normal.  Mouth/Throat: Oropharynx is clear and moist.  Eyes: Pupils are equal, round, and reactive to light. Conjunctivae and EOM are normal.  Neck: Normal range of motion. Neck supple.  Cardiovascular: Normal rate, regular rhythm, normal heart sounds and intact distal pulses.  Pulmonary/Chest: Effort normal and breath sounds normal.  Abdominal: There is tenderness in the left lower quadrant.    ? Hernia left inguinal region vs tendonitis from recent yoga.  No redness or swelling.  Musculoskeletal: Normal range of motion. She exhibits no edema.  Neurological: She is alert and oriented to person, place, and time.  Skin: Skin is warm. Capillary refill takes less than 2 seconds.  Psychiatric: She has a normal mood and affect. Her behavior is normal. Judgment and thought content normal.  Nursing note and vitals reviewed.    ED Treatments / Results  Labs (all labs ordered are listed, but only abnormal results are displayed) Labs Reviewed  COMPREHENSIVE METABOLIC PANEL - Abnormal; Notable for the following components:      Result Value   Total Bilirubin 1.3 (*)    All other components within normal limits  CBC WITH DIFFERENTIAL/PLATELET  LIPASE, BLOOD  URINALYSIS, ROUTINE W REFLEX MICROSCOPIC    EKG None  Radiology Ct Abdomen Pelvis W Contrast  Result Date: 10/05/2017 CLINICAL DATA:  Left hip and left leg pain for 4 days, some swelling around the surgical site after is to rectum E in January EXAM: CT ABDOMEN AND PELVIS WITH CONTRAST TECHNIQUE: Multidetector CT imaging of the abdomen and pelvis was performed using the standard protocol following bolus administration of intravenous contrast. CONTRAST:  ISOVUE-300 IOPAMIDOL (ISOVUE-300) INJECTION 61% COMPARISON:  CT abdomen pelvis of 08/02/2017 FINDINGS: Lower chest: The lung  bases are clear. No pneumonia or effusion is seen. Heart size is normal. Hepatobiliary: The liver enhances with no focal abnormality and no ductal dilatation is seen. No calcified gallstones are noted. Pancreas: The pancreas is normal in size and the pancreatic duct is not dilated. Spleen: The spleen is unremarkable. Adrenals/Urinary Tract: The adrenal glands appear normal. The kidneys enhance with no calculus or mass. No hydronephrosis is seen. The ureters are normal in caliber. The urinary  bladder is not well distended but no abnormality is seen. Stomach/Bowel: The stomach is minimally distended with fluid. No abnormality is noted. No abnormality of small bowel is seen. No abnormality of the colon is seen. The terminal ileum and appendix are unremarkable. Vascular/Lymphatic: The abdominal aorta is normal in caliber. No adenopathy is seen. Reproductive: The uterus has been resected. Prominent ovaries are slightly more prominent possibly due to multiple follicles bilaterally left-greater-than-right. No definite free fluid is seen within the pelvis. Other: No abdominal wall abnormality is seen. No retroperitoneal abnormality is seen. Musculoskeletal: The lumbar vertebrae are in normal alignment with normal intervertebral disc spaces. The SI joints are unremarkable. IMPRESSION: 1. No explanation for the patient's left hip and left low back pain is seen. 2. The terminal ileum and the appendix are unremarkable. 3. Somewhat prominent ovaries, slightly more prominent than on the CT of 08/02/2017, most likely due to the presence of multiple follicles. Electronically Signed   By: Dwyane Dee M.D.   On: 10/05/2017 11:24    Procedures Procedures (including critical care time)  Medications Ordered in ED Medications  iopamidol (ISOVUE-300) 61 % injection (has no administration in time range)  morphine 4 MG/ML injection 4 mg (4 mg Intravenous Given 10/05/17 0920)  ondansetron (ZOFRAN) injection 4 mg (4 mg Intravenous  Given 10/05/17 0920)  sodium chloride 0.9 % bolus 1,000 mL (0 mLs Intravenous Stopped 10/05/17 1032)  iopamidol (ISOVUE-300) 61 % injection 100 mL (100 mLs Intravenous Contrast Given 10/05/17 1059)     Initial Impression / Assessment and Plan / ED Course  I have reviewed the triage vital signs and the nursing notes.  Pertinent labs & imaging results that were available during my care of the patient were reviewed by me and considered in my medical decision making (see chart for details).    Pt is feeling better.  Sx are not consistent with dvt.  No calf tenderness or swelling.  No hernia seen on ct.  Pt to f/u with surgery if sx persist (? Small hernia), but I think it may be tendonitis/hip flexor irritation.  Final Clinical Impressions(s) / ED Diagnoses   Final diagnoses:  Tendonitis    ED Discharge Orders        Ordered    ibuprofen (ADVIL,MOTRIN) 600 MG tablet  Every 6 hours PRN     10/05/17 1136    predniSONE (STERAPRED UNI-PAK 21 TAB) 10 MG (21) TBPK tablet     10/05/17 1136       Jacalyn Lefevre, MD 10/05/17 1137

## 2017-10-05 NOTE — Telephone Encounter (Addendum)
Spoke with patient. S/p TLH 07/31/17. Reports left lower pelvic pain and left leg pain, started on 3/31 became worse last night, unable to sleep, up all night. Hard to stand and put pressure on left leg, shooting pain through leg, worse with movement. Reports swelling in "pelvic area, inner thigh", no redness. 8/10 on pain scale, pain not relieved with motrin. Denies bleeding, fever/chills, N/V, SOB. Offered patient OV today at 1pm with Dr. Oscar LaJertson, patient declined stating [facility], requesting earlier appointment. Advised will review with Dr. Oscar LaJertson and return call. Patient agreeable.

## 2018-01-03 ENCOUNTER — Emergency Department (HOSPITAL_COMMUNITY)
Admission: EM | Admit: 2018-01-03 | Discharge: 2018-01-03 | Disposition: A | Discharge status: Home / Self Care | Payer: 59 | Attending: Emergency Medicine | Admitting: Emergency Medicine

## 2018-01-03 ENCOUNTER — Encounter (HOSPITAL_COMMUNITY): Payer: Self-pay | Admitting: Emergency Medicine

## 2018-01-03 ENCOUNTER — Other Ambulatory Visit: Payer: Self-pay

## 2018-01-03 DIAGNOSIS — Y929 Unspecified place or not applicable: Secondary | ICD-10-CM | POA: Insufficient documentation

## 2018-01-03 DIAGNOSIS — Z79899 Other long term (current) drug therapy: Secondary | ICD-10-CM | POA: Diagnosis not present

## 2018-01-03 DIAGNOSIS — S3992XA Unspecified injury of lower back, initial encounter: Secondary | ICD-10-CM | POA: Diagnosis present

## 2018-01-03 DIAGNOSIS — Y939 Activity, unspecified: Secondary | ICD-10-CM | POA: Insufficient documentation

## 2018-01-03 DIAGNOSIS — Y998 Other external cause status: Secondary | ICD-10-CM | POA: Insufficient documentation

## 2018-01-03 DIAGNOSIS — S39012A Strain of muscle, fascia and tendon of lower back, initial encounter: Secondary | ICD-10-CM | POA: Diagnosis not present

## 2018-01-03 DIAGNOSIS — Y33XXXA Other specified events, undetermined intent, initial encounter: Secondary | ICD-10-CM | POA: Insufficient documentation

## 2018-01-03 MED ORDER — ORPHENADRINE CITRATE ER 100 MG PO TB12: 100.0000 mg | ORAL_TABLET | Freq: Two times a day (BID) | ORAL | 0 refills | Status: AC

## 2018-01-03 MED ORDER — MELOXICAM 7.5 MG PO TABS: 7.5000 mg | ORAL_TABLET | Freq: Every day | ORAL | 0 refills | Status: AC

## 2018-01-03 MED ORDER — ORPHENADRINE CITRATE ER 100 MG PO TB12
100.0000 mg | ORAL_TABLET | Freq: Two times a day (BID) | ORAL | Status: AC
  Administered 2018-01-03: 100 mg via ORAL
  Filled 2018-01-03: qty 1

## 2018-01-03 MED ORDER — KETOROLAC TROMETHAMINE 60 MG/2ML IM SOLN
30.0000 mg | Freq: Once | INTRAMUSCULAR | Status: AC
  Administered 2018-01-03: 30 mg via INTRAMUSCULAR
  Filled 2018-01-03: qty 2

## 2018-01-03 NOTE — Discharge Instructions (Addendum)
Warm compresses to low back for 20 minutes at a time. Meloxicam as prescribed- DO NOT take if you are taking Advil/Motrin. Norflex as prescribed for muscle spasm, do not take if driving or operating machinery. Follow up with your PCP and physical therapy. Return to ER for worsening or concerning symptoms.

## 2018-01-03 NOTE — ED Provider Notes (Signed)
[facility]   CSN: [id number] Arrival date & time: 01/03/18  1936     History   Chief Complaint Chief Complaint  Patient presents with  . Back Pain    HPI Kasarah Clovis RileyMitchell is a 27 y.o. female.  27 yo female presents with complaint of low back pain, radiating down left posterior thigh to her knee, onset 6 hours ago. Patient states she has been moving houses, doing a lot of lifting/twisting and pain started 6 hours prior to arrival in the ER, no particular injury, no falls/trauma, has not taken anything for her pain prior to arrival today. Pain is worse with movement, improves with holding still. No prior back problems, is in physical therapy for a problem with her psoas muscle following a c-section in January. Not pregnant or breast feeding, denies loss of bowel or bladder control, abdominal pain, groin numbness. No other complaints or concerns.      Past Medical History:  Diagnosis Date  . Anxiety    no meds  . Cardiac murmur    no problems  . Depression    no meds  . GERD (gastroesophageal reflux disease)    no meds, diet controlled  . HSV infection   . Otitis   . PCOS (polycystic ovarian syndrome)   . Seasonal allergies   . SVD (spontaneous vaginal delivery)    x 3  . Urticaria     Patient Active Problem List   Diagnosis Date Noted  . Status post laparoscopic hysterectomy 07/31/2017  . Other and unspecified ovarian cyst 07/19/2012  . Heart murmur 03/29/2011  . Chest pain 03/29/2011  . GERD (gastroesophageal reflux disease) 03/29/2011    Past Surgical History:  Procedure Laterality Date  . ABDOMINAL HYSTERECTOMY    . CYSTOSCOPY N/A 07/31/2017   Procedure: CYSTOSCOPY;  Surgeon: Romualdo BolkJertson, Jill Evelyn, MD;  Location: [facility];  Service: Gynecology;  Laterality: N/A;  . ESSURE TUBAL LIGATION    . TOOTH EXTRACTION    . TOTAL LAPAROSCOPIC HYSTERECTOMY WITH SALPINGECTOMY Bilateral 07/31/2017   Procedure: TOTAL  LAPAROSCOPIC HYSTERECTOMY WITH SALPINGECTOMY;  Surgeon: Romualdo BolkJertson, Jill Evelyn, MD;  Location: [facility];  Service: Gynecology;  Laterality: Bilateral;     OB History    Gravida  3   Para  3   Term  0   Preterm  3   AB      Living  3     SAB      TAB      Ectopic      Multiple      Live Births  3            Home Medications    Prior to Admission medications   Medication Sig Start Date End Date Taking? Authorizing Provider  Cholecalciferol (VITAMIN D3 PO) Take 1 capsule by mouth daily.    [provider]  fexofenadine (ALLEGRA) 180 MG tablet Take 180 mg by mouth daily as needed for allergies.  05/23/15   [provider]  meloxicam (MOBIC) 7.5 MG tablet Take 1 tablet (7.5 mg total) by mouth daily for 10 days. 01/03/18 01/13/18  Jeannie FendMurphy, Malakhi Markwood A, PA-C  Multiple Vitamin (MULTIVITAMIN) tablet Take 1 tablet by mouth daily.      [provider]  orphenadrine (NORFLEX) 100 MG tablet Take 1 tablet (100 mg total) by mouth 2 (two) times daily for 10 days. 01/03/18 01/13/18  Jeannie FendMurphy, Glenyce Randle A, PA-C  predniSONE (STERAPRED UNI-PAK 21 TAB) 10 MG (21) TBPK  tablet Take 6 tabs by mouth daily  for 2 days, then 5 tabs for 2 days, then 4 tabs for 2 days, then 3 tabs for 2 days, 2 tabs for 2 days, then 1 tab by mouth daily for 2 days 10/05/17   Jacalyn Lefevre, MD  valACYclovir (VALTREX) 500 MG tablet Take 1 tablet (500 mg total) by mouth daily. Increase to BID for 3 days as needed Patient taking differently: Take 500 mg by mouth daily.  06/06/17   Romualdo Bolk, MD    Family History Family History  Problem Relation Age of Onset  . Heart murmur Father   . Blindness Mother 41  . Heart murmur Paternal Grandmother   . Heart failure Paternal Aunt     Social History Social History   Tobacco Use  . Smoking status: Never Smoker  . Smokeless tobacco: Never Used  Substance Use Topics  . Alcohol use: Yes    Alcohol/week: 0.0 oz    Comment: twice a month   . Drug  use: No     Allergies   Patient has no known allergies.   Review of Systems Review of Systems  Constitutional: Negative for fever.  Gastrointestinal: Negative for abdominal pain.  Genitourinary: Negative for difficulty urinating.  Musculoskeletal: Positive for back pain and myalgias.  Skin: Negative for color change, rash and wound.  Allergic/Immunologic: Negative for immunocompromised state.  Neurological: Negative for weakness and numbness.  Hematological: Does not bruise/bleed easily.  Psychiatric/Behavioral: Negative for confusion.  All other systems reviewed and are negative.    Physical Exam Updated Vital Signs BP (!) 150/88 (BP Location: Right Arm)   Pulse 72   Temp 98.3 F (36.8 C) (Oral)   Resp 16   Ht 5\' 3"  (1.6 m)   Wt 97.5 kg (215 lb)   LMP 07/17/2017 (Exact Date)   SpO2 100%   BMI 38.09 kg/m   Physical Exam  Constitutional: She is oriented to person, place, and time. She appears well-developed and well-nourished. No distress.  HENT:  Head: Normocephalic and atraumatic.  Cardiovascular: Intact distal pulses.  Pulmonary/Chest: Effort normal.  Musculoskeletal: She exhibits tenderness. She exhibits no deformity.       Thoracic back: She exhibits no tenderness and no bony tenderness.       Lumbar back: She exhibits decreased range of motion and pain. She exhibits no tenderness, no bony tenderness, no swelling, no edema and no deformity.       Back:  Left and right SI joint TTP, pain worse with right lateral bending and forward flexion   Neurological: She is alert and oriented to person, place, and time. She has normal strength. GCS eye subscore is 4. GCS verbal subscore is 5. GCS motor subscore is 6.  Reflex Scores:      Patellar reflexes are 2+ on the right side and 2+ on the left side. Skin: Skin is warm and dry. No rash noted. She is not diaphoretic. No erythema.  Psychiatric: She has a normal mood and affect. Her behavior is normal.  Nursing note and  vitals reviewed.    ED Treatments / Results  Labs (all labs ordered are listed, but only abnormal results are displayed) Labs Reviewed - No data to display  EKG None  Radiology No results found.  Procedures Procedures (including critical care time)  Medications Ordered in ED Medications  ketorolac (TORADOL) injection 30 mg (30 mg Intramuscular Given 01/03/18 2037)  orphenadrine (NORFLEX) 12 hr tablet 100 mg (100 mg Oral  Given 01/03/18 2037)     Initial Impression / Assessment and Plan / ED Course  I have reviewed the triage vital signs and the nursing notes.  Pertinent labs & imaging results that were available during my care of the patient were reviewed by me and considered in my medical decision making (see chart for details).  Clinical Course as of Jan 04 2051  Wed Jan 03, 2018  2048 SUBJECTIVE:  Amsi Grimley is a 27 y.o. female who complains of  low back pain 6 hour(s) ago. The pain is positional with bending or lifting, with radiation down the left leg. Mechanism of injury: heavy lifting/twistig. Symptoms have been acute and constant since that time. Prior history of back problems: no prior back problems. There is no numbness in the legs.  OBJECTIVE: Vital signs as noted above. Patient appears to be in mild to moderate pain, antalgic gait noted. Lumbosacral spine area reveals no local tenderness or mass. Painful and reduced LS ROM noted. DTR's, motor strength and sensation normal, including heel and toe gait.  Peripheral pulses are palpable.   ASSESSMENT:  lumbar strain  PLAN: For acute pain, rest, intermittent application of cold packs (later, may switch to heat, but do not sleep on heating pad), analgesics and muscle relaxants are recommended. Discussed longer term treatment plan of prn NSAID's and discussed a home back care exercise program with flexion exercise routine. Proper lifting with avoidance of heavy lifting discussed. Consider Physical Therapy and XRay  studies if not improving. Call or return to clinic prn if these symptoms worsen or fail to improve as anticipated.    [LM]    Clinical Course User Index [LM] Jeannie Fend, PA-C    Final Clinical Impressions(s) / ED Diagnoses   Final diagnoses:  Strain of lumbar region, initial encounter    ED Discharge Orders        Ordered    meloxicam (MOBIC) 7.5 MG tablet  Daily     01/03/18 2023    orphenadrine (NORFLEX) 100 MG tablet  2 times daily     01/03/18 2023       Jeannie Fend, PA-C 01/03/18 2052    Melene Plan, DO 01/03/18 2150

## 2018-01-03 NOTE — ED Triage Notes (Signed)
Patient c/o right lower back pain after moving today. Reports pain worsens with movement. Denies urinary sx.

## 2018-01-03 NOTE — ED Notes (Signed)
Patient verbalized understanding of discharge instructions and prescriptions, no questions. Patient ambulated out of ED with steady gait in no distress.  

## 2018-05-01 NOTE — Progress Notes (Deleted)
27 y.o. G3P0303 Married Black or [place] American Not Hispanic or Latino female here for annual exam.      Patient's last menstrual period was 07/17/2017 (exact date).          Sexually active: {yes no:314532}  The current method of family planning is {contraception:315051}.    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 She had a colposcopy, didn't come back for a couple of years. No leep. TDaP:  Unsure  Gardasil: completed all 3    reports that she has never smoked. She has never used smokeless tobacco. She reports that she drinks alcohol. She reports that she does not use drugs.  Past Medical History:  Diagnosis Date  . Anxiety    no meds  . Cardiac murmur    no problems  . Depression    no meds  . GERD (gastroesophageal reflux disease)    no meds, diet controlled  . HSV infection   . Otitis   . PCOS (polycystic ovarian syndrome)   . Seasonal allergies   . SVD (spontaneous vaginal delivery)    x 3  . Urticaria     Past Surgical History:  Procedure Laterality Date  . ABDOMINAL HYSTERECTOMY    . CYSTOSCOPY N/A 07/31/2017   Procedure: CYSTOSCOPY;  Surgeon: Romualdo Bolk, MD;  Location: [facility];  Service: Gynecology;  Laterality: N/A;  . ESSURE TUBAL LIGATION    . TOOTH EXTRACTION    . TOTAL LAPAROSCOPIC HYSTERECTOMY WITH SALPINGECTOMY Bilateral 07/31/2017   Procedure: TOTAL LAPAROSCOPIC HYSTERECTOMY WITH SALPINGECTOMY;  Surgeon: Romualdo Bolk, MD;  Location: [facility];  Service: Gynecology;  Laterality: Bilateral;    Current Outpatient Medications  Medication Sig Dispense Refill  . Cholecalciferol (VITAMIN D3 PO) Take 1 capsule by mouth daily.    . fexofenadine (ALLEGRA) 180 MG tablet Take 180 mg by mouth daily as needed for allergies.     . Multiple  Vitamin (MULTIVITAMIN) tablet Take 1 tablet by mouth daily.      . predniSONE (STERAPRED UNI-PAK 21 TAB) 10 MG (21) TBPK tablet Take 6 tabs by mouth daily  for 2 days, then 5 tabs for 2 days, then 4 tabs for 2 days, then 3 tabs for 2 days, 2 tabs for 2 days, then 1 tab by mouth daily for 2 days 42 tablet 0  . valACYclovir (VALTREX) 500 MG tablet Take 1 tablet (500 mg total) by mouth daily. Increase to BID for 3 days as needed (Patient taking differently: Take 500 mg by mouth daily. ) 90 tablet 3   No current facility-administered medications for this visit.     Family History  Problem Relation Age of Onset  . Heart murmur Father   . Blindness Mother 26  . Heart murmur Paternal Grandmother   . Heart failure Paternal Aunt     Review of Systems  Exam:   LMP 07/17/2017 (Exact Date)   Weight change: @WEIGHTCHANGE @ Height:      Ht Readings from Last 3 Encounters:  01/03/18 5\' 3"  (1.6 m)  07/31/17 5\' 3"  (1.6 m)  07/19/17 5\' 3"  (1.6 m)    General appearance: alert, cooperative and appears stated age Head: Normocephalic, without obvious abnormality, atraumatic Neck: no adenopathy, supple, symmetrical, trachea midline and thyroid {CHL AMB PHY EX THYROID NORM DEFAULT:[phone]::"normal to inspection and palpation"}  Lungs: clear to auscultation bilaterally Cardiovascular: regular rate and rhythm Breasts: {Exam; breast:13139::"normal appearance, no masses or tenderness"} Abdomen: soft, non-tender; non distended,  no masses,  no organomegaly Extremities: extremities normal, atraumatic, no cyanosis or edema Skin: Skin color, texture, turgor normal. No rashes or lesions Lymph nodes: Cervical, supraclavicular, and axillary nodes normal. No abnormal inguinal nodes palpated Neurologic: Grossly normal   Pelvic: External genitalia:  no lesions              Urethra:  normal appearing urethra with no masses, tenderness or lesions              Bartholins and Skenes: normal                 Vagina:  normal appearing vagina with normal color and discharge, no lesions              Cervix: {CHL AMB PHY EX CERVIX NORM [phone]::"no lesions"}               Bimanual Exam:  Uterus:  {CHL AMB PHY EX UTERUS NORM DEFAULT:[phone]::"normal size, contour, position, consistency, mobility, non-tender"}              Adnexa: {CHL AMB PHY EX ADNEXA NO MASS [phone]::"no mass, fullness, tenderness"}               Rectovaginal: Confirms               Anus:  normal sphincter tone, no lesions  Chaperone was present for exam.  A:  Well Woman with normal exam  P:

## 2018-05-03 ENCOUNTER — Ambulatory Visit: Payer: 59 | Admitting: Obstetrics and Gynecology

## 2018-12-04 IMAGING — CT CT ABD-PELV W/ CM
1 of 3 series · 13 of 32 positions shown, 18 images · IV contrast (iopamidol)
Comparison: 07/07/2012

CLINICAL DATA: Status post hysterectomy on 07/31/2017. Abdominal
pain and cramping with nausea and vomiting.

EXAM:
CT ABDOMEN AND PELVIS WITH CONTRAST
TECHNIQUE: Multidetector CT imaging of the abdomen and pelvis was performed
using the standard protocol following bolus administration of
intravenous contrast.
CONTRAST:  100mL [id number] IOPAMIDOL ([id number]) INJECTION 61%

[Series 2: routine abdomen/pelvis with · axial · 0.89mm/px · z∈[+604,+1034]mm · 13 of 100 slices shown, 18 images]
[im 7/100  soft-tissue]
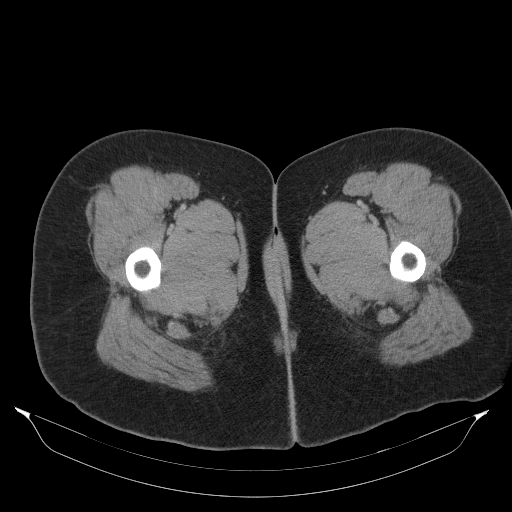
[im 7/100  bone]
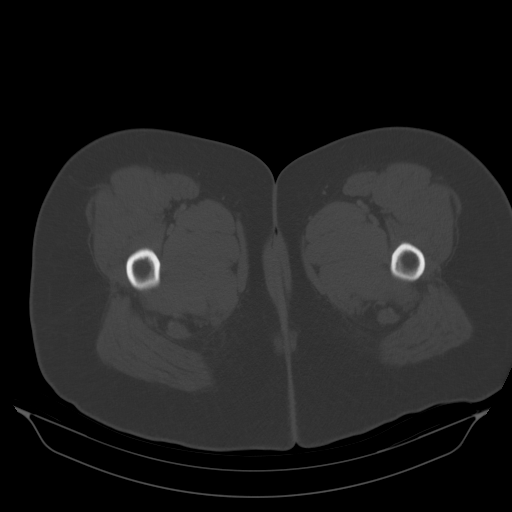
[im 13/100  soft-tissue]
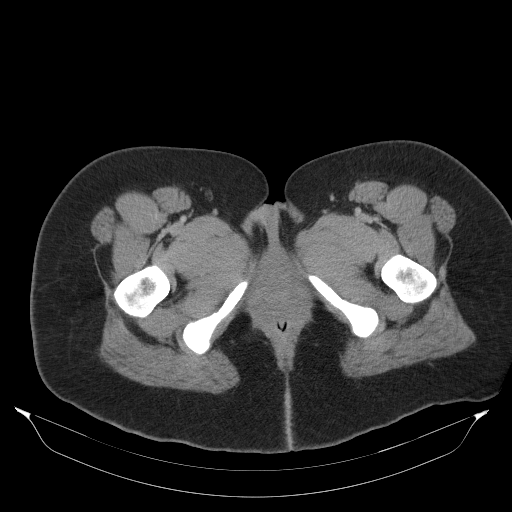
[im 25/100  soft-tissue]
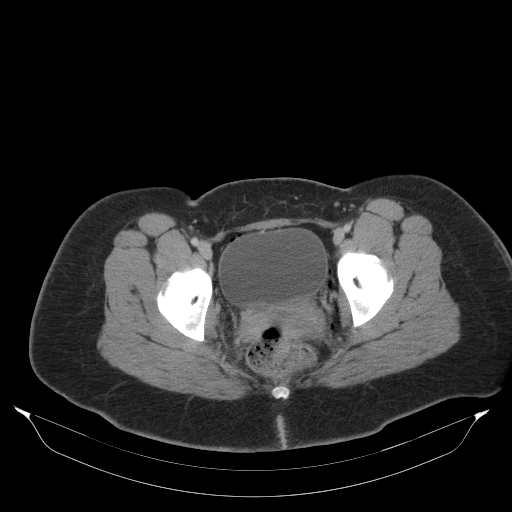
[im 31/100  soft-tissue]
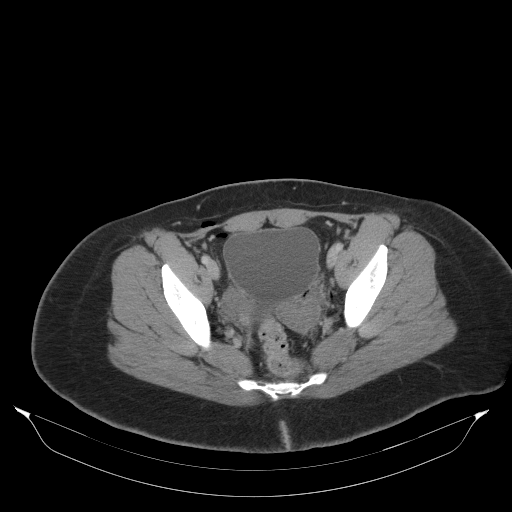
[im 38/100  soft-tissue]
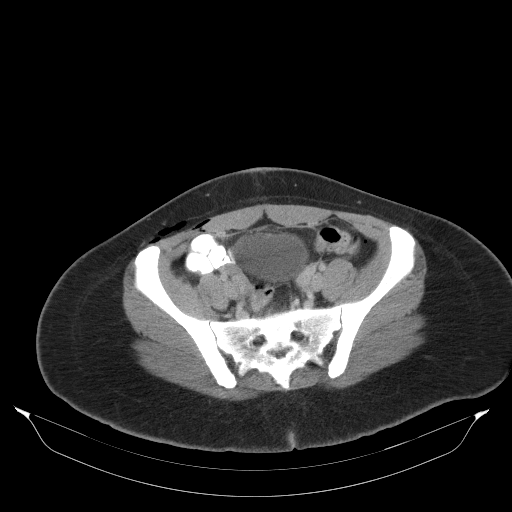
[im 44/100  soft-tissue]
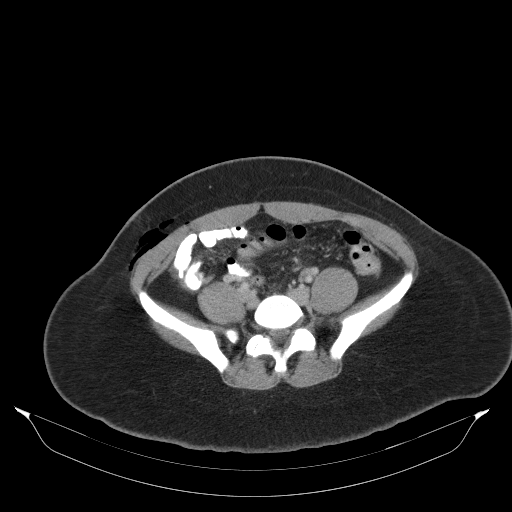
[im 56/100  soft-tissue]
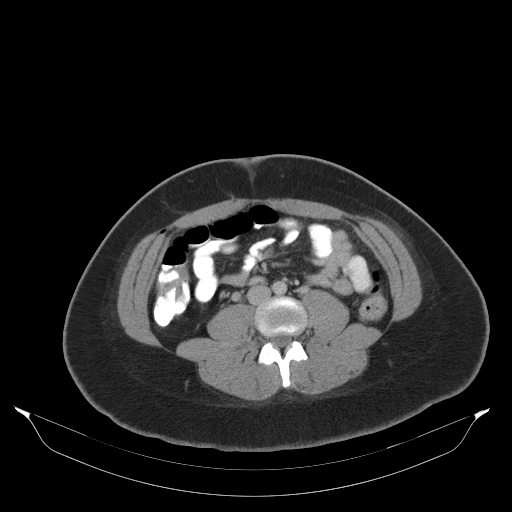
[im 62/100  soft-tissue]
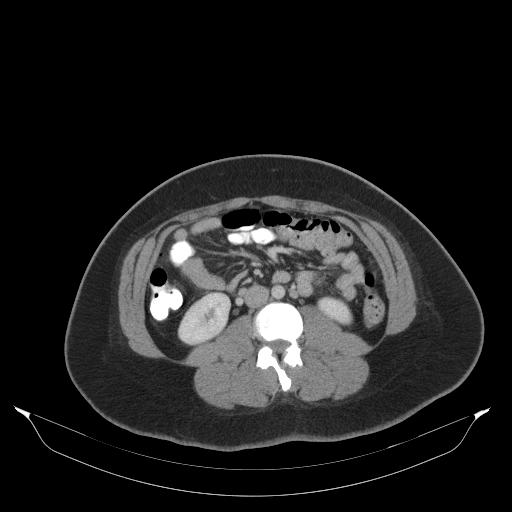
[im 69/100  soft-tissue]
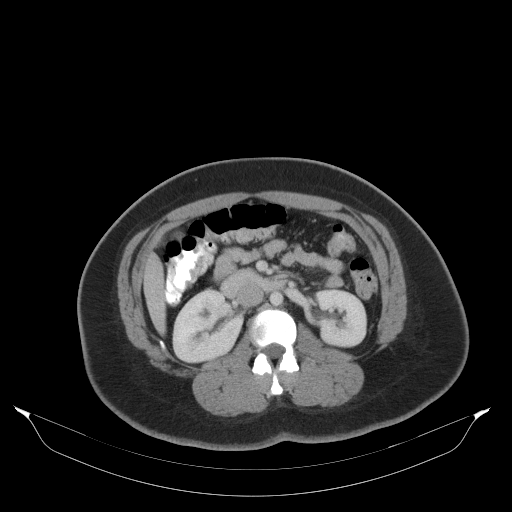
[im 69/100  bone]
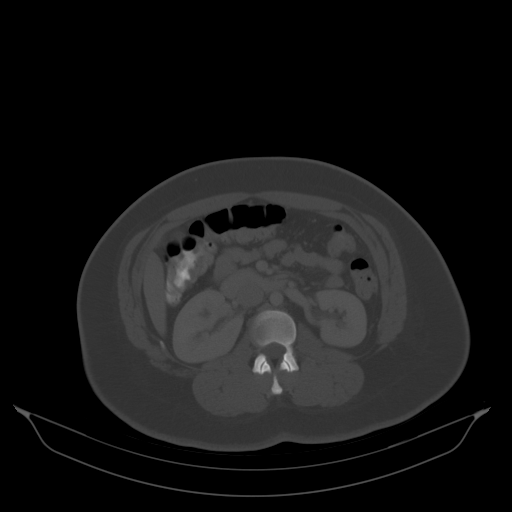
[im 75/100  soft-tissue]
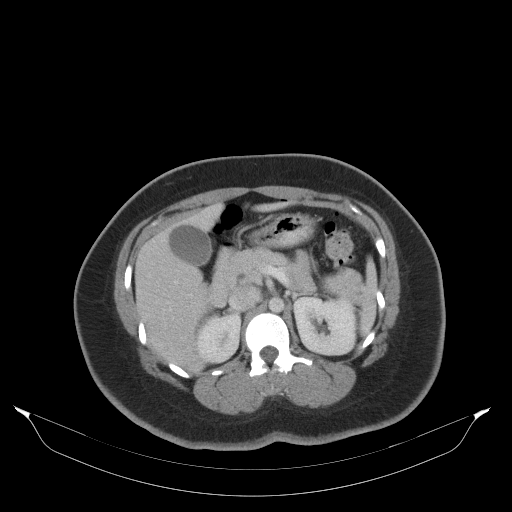
[im 75/100  lung]
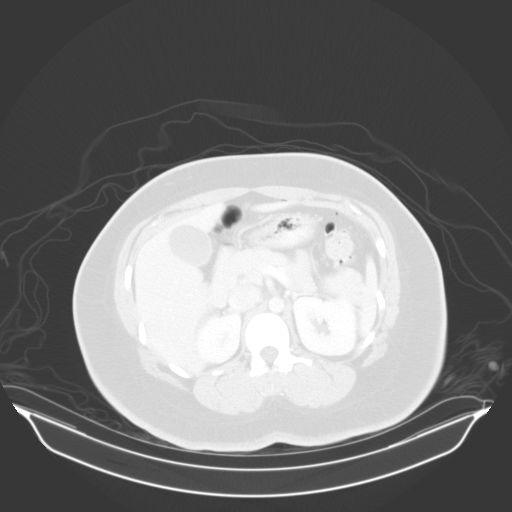
[im 81/100  lung]
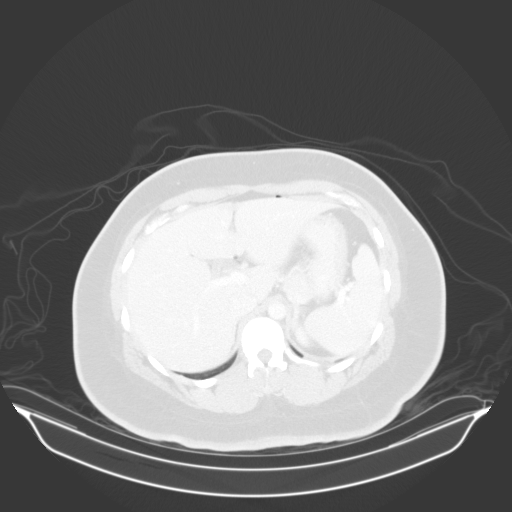
[im 87/100  soft-tissue]
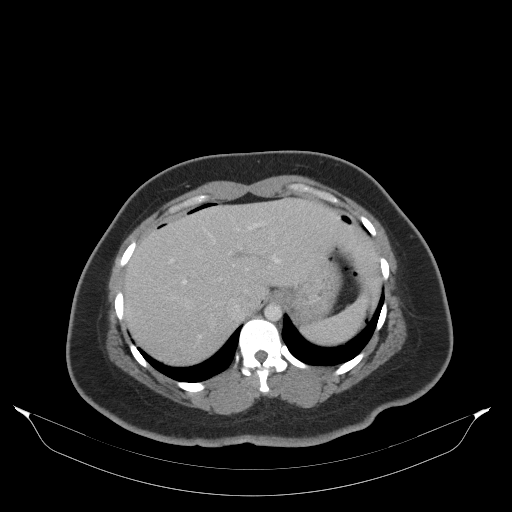
[im 87/100  lung]
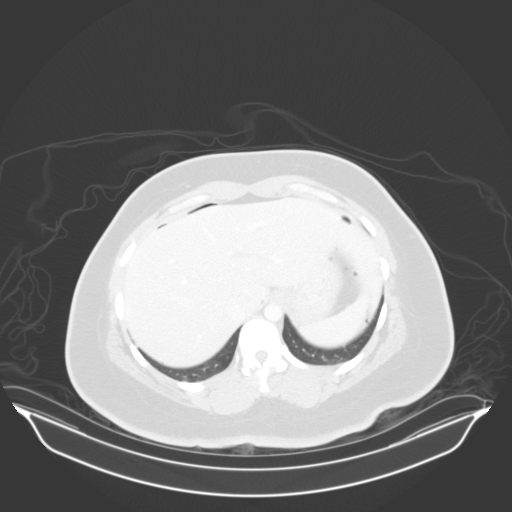
[im 93/100  soft-tissue]
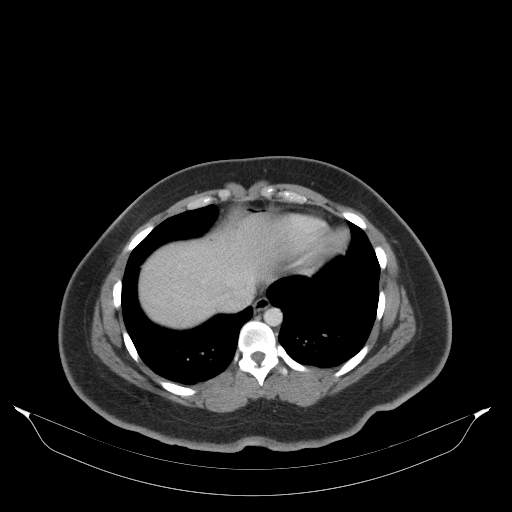
[im 93/100  lung]
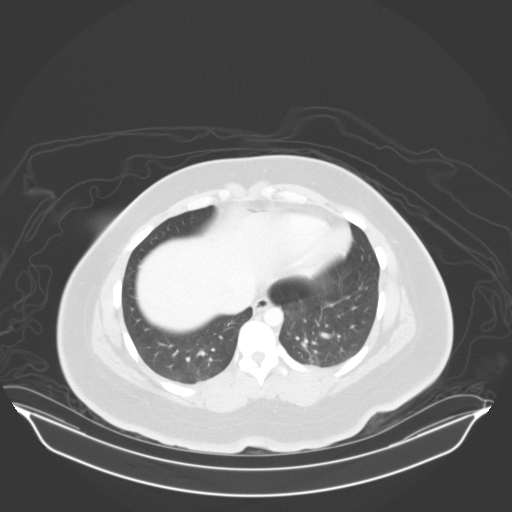

[13 of 32 positions shown; findings below may reference images not displayed]

FINDINGS: Lower chest: Heart unremarkable no pleural effusion or pneumothorax.

Hepatobiliary: No focal abnormality within the liver parenchyma.
There is no evidence for gallstones, gallbladder wall thickening, or
pericholecystic fluid. No intrahepatic or extrahepatic biliary
dilation.

Pancreas: No focal mass lesion. No dilatation of the main duct. No
intraparenchymal cyst. No peripancreatic edema.

Spleen: No splenomegaly. No focal mass lesion.

Adrenals/Urinary Tract: No adrenal nodule or mass. Kidneys
unremarkable No evidence for hydroureter.. No evidence for
hydroureter or contrast extravasation from either ureter. The
urinary bladder appears normal for the degree of distention. Delayed
imaging shows good bladder distention with opacification of the
bladder lumen. No evidence for focal bladder wall thickening or
irregularity. No evidence for intraperitoneal or extraperitoneal
bladder leak.

Stomach/Bowel: Stomach is nondistended. No gastric wall thickening.
No evidence of outlet obstruction. Duodenum is normally positioned
as is the ligament of Treitz. No small bowel wall thickening. No
small bowel dilatation. The terminal ileum is normal. The appendix
is normal. No gross colonic mass. No colonic wall thickening. No
substantial diverticular change.

Vascular/Lymphatic: No abdominal aortic aneurysm. No abdominal
aortic atherosclerotic calcification. There is no gastrohepatic or
hepatoduodenal ligament lymphadenopathy. No intraperitoneal or
retroperitoneal lymphadenopathy. No pelvic sidewall lymphadenopathy.

Reproductive: Uterus surgically absent.  There is no adnexal mass.

Other: Trace free fluid is seen in the cul-de-sac, not unexpected
postoperative day 2 from hysterectomy. No focal or rim enhancing
fluid collection in the pelvis. Extraperitoneal gas in the pelvic
floor is associated with intraperitoneal free gas and gas within the
right anterior abdominal wall subcutaneous fat. Intra and
extraperitoneal gas not unexpected 2 days out from surgery.

Musculoskeletal: Bone windows reveal no worrisome lytic or sclerotic
osseous lesions.
IMPRESSION: 1. Expected postoperative appearance in this patient 2 days out from
total laparoscopic hysterectomy with bilateral salpingectomy.
Specifically, no evidence for hemoperitoneum with only trace fluid
noted in the cul-de-sac. No focal hematoma or rim enhancing fluid
collection in the pelvis. No bowel obstruction or bowel wall
thickening. No evidence for ureteral or bladder injury.
2. There is gas scattered in the extraperitoneal pelvic floor, a
small volume of intraperitoneal gas, and gas in the subcutaneous fat
of the anterior abdominal wall, not unexpected 2 days after surgery.
